# Patient Record
Sex: Female | Born: 1950 | Race: Black or African American | Hispanic: No | Marital: Married | Smoking: Never smoker
Health system: Southern US, Community
[De-identification: ages and names within clinical notes are randomized; demographics above are authoritative.]

## PROBLEM LIST (undated history)

## (undated) DIAGNOSIS — E785 Hyperlipidemia, unspecified: Secondary | ICD-10-CM

## (undated) DIAGNOSIS — E119 Type 2 diabetes mellitus without complications: Secondary | ICD-10-CM

## (undated) DIAGNOSIS — J309 Allergic rhinitis, unspecified: Secondary | ICD-10-CM

## (undated) DIAGNOSIS — E669 Obesity, unspecified: Secondary | ICD-10-CM

## (undated) DIAGNOSIS — I10 Essential (primary) hypertension: Secondary | ICD-10-CM

## (undated) DIAGNOSIS — E042 Nontoxic multinodular goiter: Secondary | ICD-10-CM

## (undated) DIAGNOSIS — G4733 Obstructive sleep apnea (adult) (pediatric): Secondary | ICD-10-CM

## (undated) DIAGNOSIS — H409 Unspecified glaucoma: Secondary | ICD-10-CM

## (undated) DIAGNOSIS — I517 Cardiomegaly: Secondary | ICD-10-CM

## (undated) DIAGNOSIS — E1139 Type 2 diabetes mellitus with other diabetic ophthalmic complication: Secondary | ICD-10-CM

## (undated) HISTORY — DX: Type 2 diabetes mellitus with other diabetic ophthalmic complication: E11.39

## (undated) HISTORY — DX: Obstructive sleep apnea (adult) (pediatric): G47.33

## (undated) HISTORY — DX: Allergic rhinitis, unspecified: J30.9

## (undated) HISTORY — DX: Unspecified glaucoma: H40.9

## (undated) HISTORY — DX: Obesity, unspecified: E66.9

## (undated) HISTORY — DX: Type 2 diabetes mellitus without complications: E11.9

## (undated) HISTORY — PX: TONSILLECTOMY: SHX5217

## (undated) HISTORY — PX: OTHER SURGICAL HISTORY: SHX169

## (undated) HISTORY — PX: VAGINAL HYSTERECTOMY: SHX2639

## (undated) HISTORY — DX: Essential (primary) hypertension: I10

## (undated) HISTORY — DX: Cardiomegaly: I51.7

## (undated) HISTORY — DX: Nontoxic multinodular goiter: E04.2

## (undated) HISTORY — DX: Hyperlipidemia, unspecified: E78.5

---

## 1999-03-01 ENCOUNTER — Ambulatory Visit (HOSPITAL_COMMUNITY): Admission: RE | Admit: 1999-03-01 | Discharge: 1999-03-01 | Payer: Self-pay | Admitting: Family Medicine

## 1999-03-01 ENCOUNTER — Encounter: Payer: Self-pay | Admitting: Family Medicine

## 1999-03-12 ENCOUNTER — Encounter: Payer: Self-pay | Admitting: Family Medicine

## 1999-03-12 ENCOUNTER — Ambulatory Visit (HOSPITAL_COMMUNITY): Admission: RE | Admit: 1999-03-12 | Discharge: 1999-03-12 | Payer: Self-pay | Admitting: Family Medicine

## 2003-07-03 ENCOUNTER — Encounter: Admission: RE | Admit: 2003-07-03 | Discharge: 2003-07-26 | Payer: Self-pay | Admitting: Family Medicine

## 2004-04-15 ENCOUNTER — Encounter: Admission: RE | Admit: 2004-04-15 | Discharge: 2004-07-14 | Payer: Self-pay | Admitting: Family Medicine

## 2004-04-18 DIAGNOSIS — E042 Nontoxic multinodular goiter: Secondary | ICD-10-CM

## 2004-04-26 ENCOUNTER — Other Ambulatory Visit: Admission: RE | Admit: 2004-04-26 | Discharge: 2004-04-26 | Payer: Self-pay | Admitting: Family Medicine

## 2004-06-28 ENCOUNTER — Ambulatory Visit (HOSPITAL_COMMUNITY): Admission: RE | Admit: 2004-06-28 | Discharge: 2004-06-28 | Payer: Self-pay | Admitting: Gastroenterology

## 2004-08-26 ENCOUNTER — Encounter: Admission: RE | Admit: 2004-08-26 | Discharge: 2004-08-26 | Payer: Self-pay | Admitting: Family Medicine

## 2005-12-17 IMAGING — CR DG CHEST 2V
2 series · 2 of 2 positions shown · non-contrast
Comparison: None.

CLINICAL DATA: Chronic cough. 
 TWO VIEW CHEST:

[view not recorded (1 of 2)]
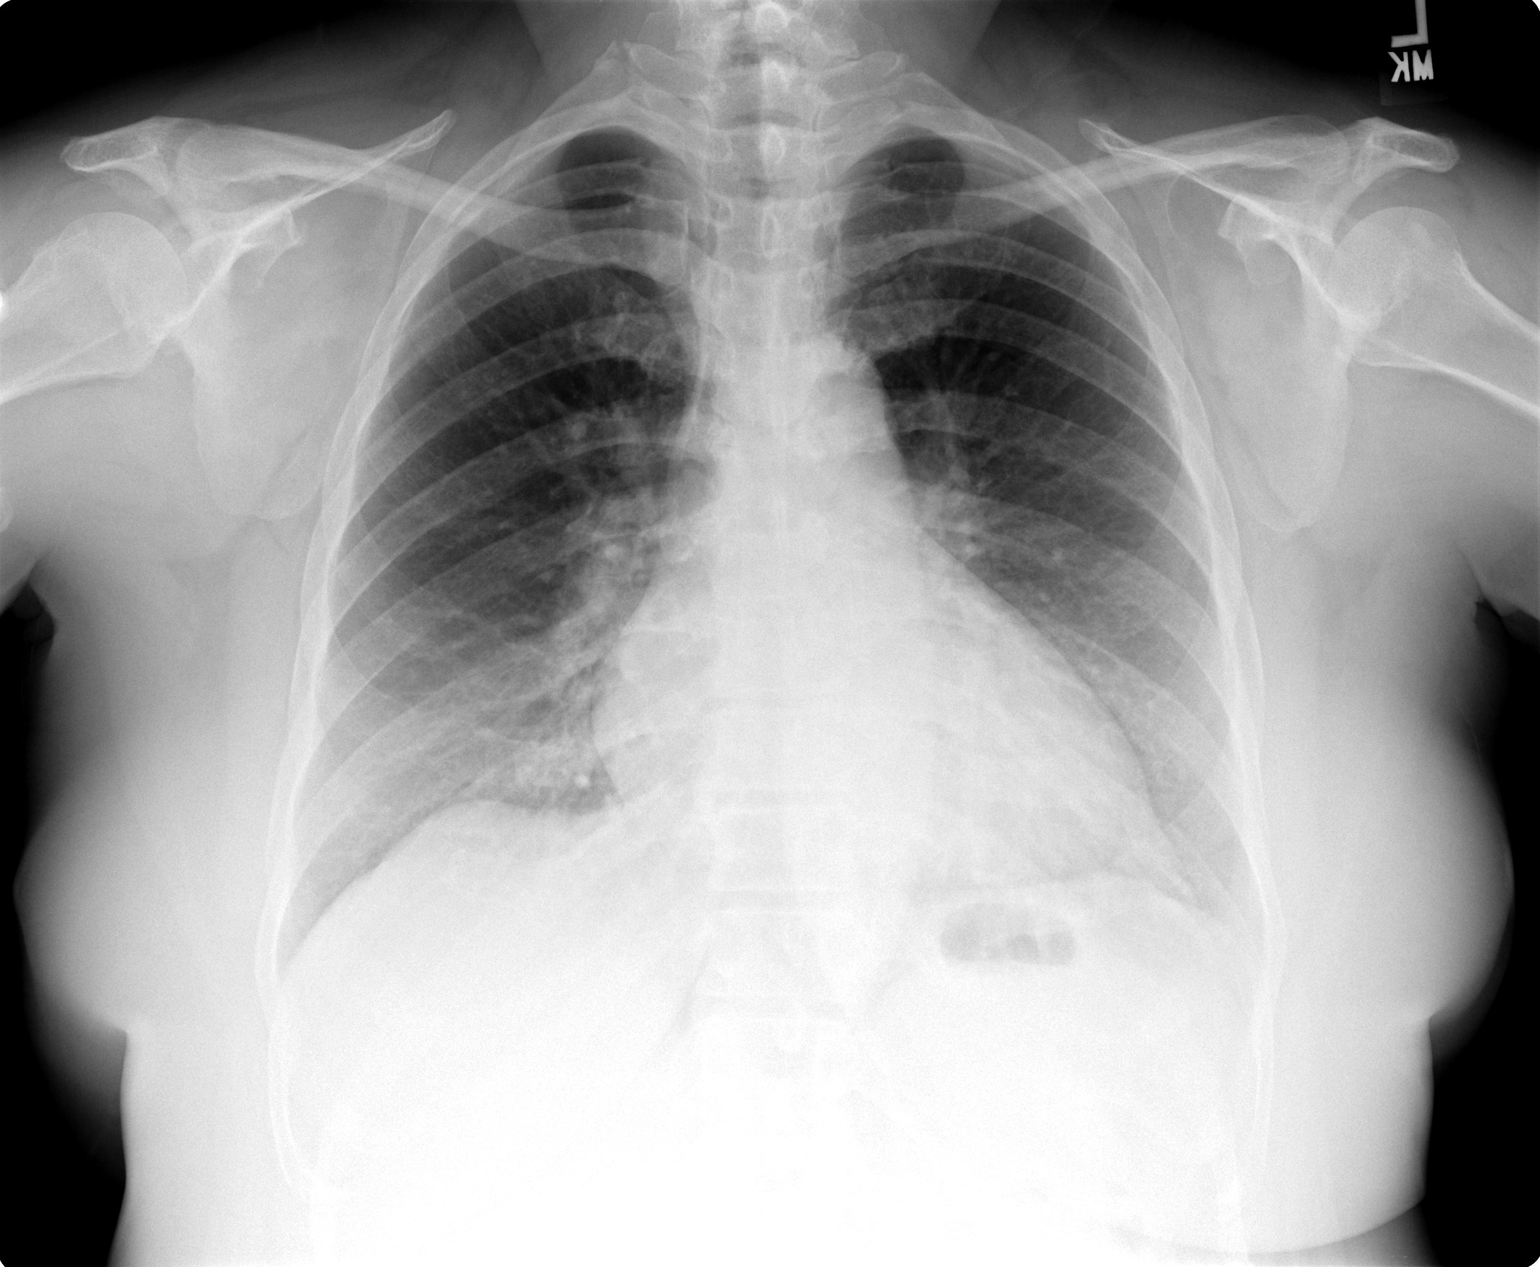

[view not recorded (2 of 2)]
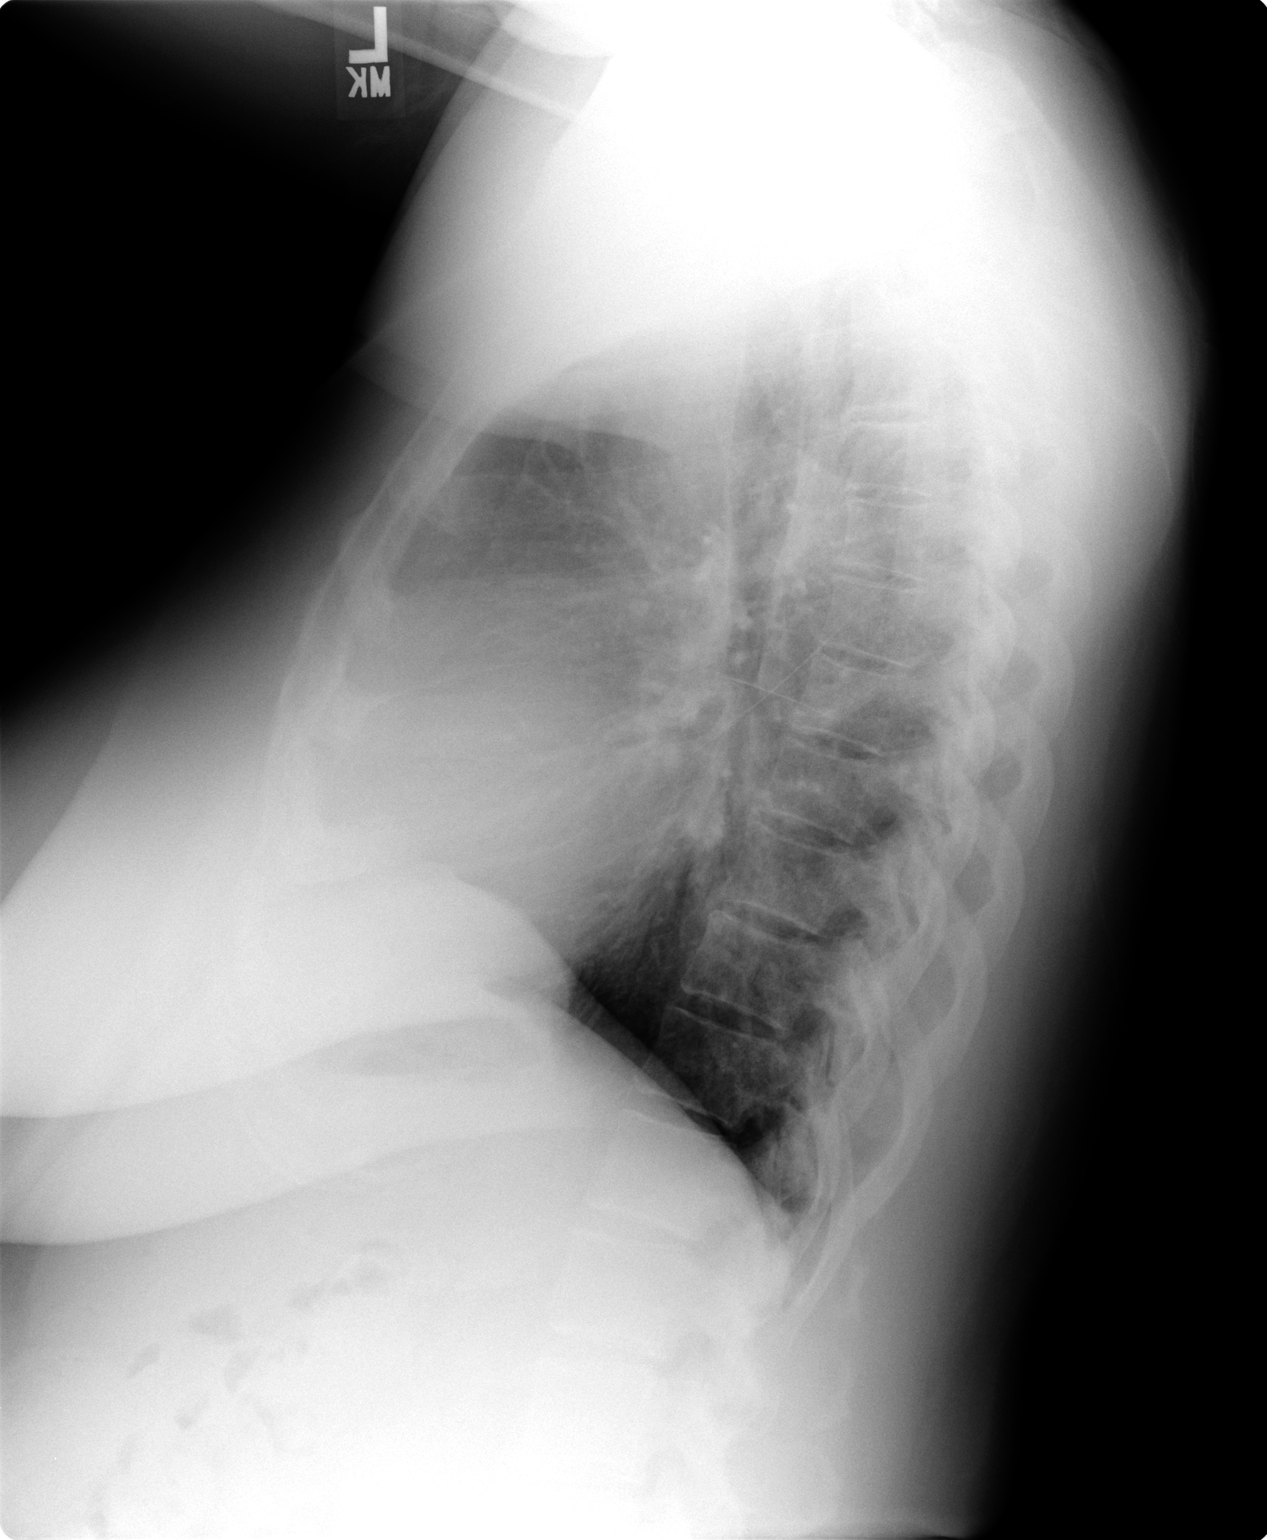

[2 of 2 positions shown; findings below may reference images not displayed]

Heart mildly enlarged.  No congestive heart failure or active disease.  Osseous structures intact.   No pleural fluid.
IMPRESSION: Mild cardiomegaly ? no active disease.

## 2010-07-04 ENCOUNTER — Ambulatory Visit: Admission: RE | Admit: 2010-07-04 | Discharge: 2010-07-04 | Payer: Self-pay | Admitting: Family Medicine

## 2010-09-23 ENCOUNTER — Ambulatory Visit (HOSPITAL_COMMUNITY)
Admission: RE | Admit: 2010-09-23 | Discharge: 2010-09-23 | Disposition: A | Discharge status: Home / Self Care | Payer: BC Managed Care – PPO | Source: Ambulatory Visit | Attending: Family Medicine | Admitting: Family Medicine

## 2010-09-23 ENCOUNTER — Encounter: Payer: Self-pay | Admitting: Emergency Medicine

## 2010-09-23 ENCOUNTER — Other Ambulatory Visit: Payer: Self-pay | Admitting: Family Medicine

## 2010-09-23 DIAGNOSIS — I517 Cardiomegaly: Secondary | ICD-10-CM | POA: Insufficient documentation

## 2010-09-23 DIAGNOSIS — R059 Cough, unspecified: Secondary | ICD-10-CM | POA: Insufficient documentation

## 2010-09-23 DIAGNOSIS — I10 Essential (primary) hypertension: Secondary | ICD-10-CM | POA: Insufficient documentation

## 2010-09-23 DIAGNOSIS — R05 Cough: Secondary | ICD-10-CM

## 2010-10-16 DIAGNOSIS — E1139 Type 2 diabetes mellitus with other diabetic ophthalmic complication: Secondary | ICD-10-CM | POA: Insufficient documentation

## 2010-10-16 DIAGNOSIS — E119 Type 2 diabetes mellitus without complications: Secondary | ICD-10-CM | POA: Insufficient documentation

## 2010-10-16 DIAGNOSIS — H409 Unspecified glaucoma: Secondary | ICD-10-CM | POA: Insufficient documentation

## 2010-10-16 DIAGNOSIS — I10 Essential (primary) hypertension: Secondary | ICD-10-CM | POA: Insufficient documentation

## 2010-10-16 DIAGNOSIS — G4733 Obstructive sleep apnea (adult) (pediatric): Secondary | ICD-10-CM | POA: Insufficient documentation

## 2010-10-16 DIAGNOSIS — E669 Obesity, unspecified: Secondary | ICD-10-CM | POA: Insufficient documentation

## 2010-10-16 DIAGNOSIS — E785 Hyperlipidemia, unspecified: Secondary | ICD-10-CM | POA: Insufficient documentation

## 2010-10-16 DIAGNOSIS — I517 Cardiomegaly: Secondary | ICD-10-CM | POA: Insufficient documentation

## 2010-10-17 ENCOUNTER — Encounter: Payer: Self-pay | Admitting: Internal Medicine

## 2010-10-17 ENCOUNTER — Institutional Professional Consult (permissible substitution) (INDEPENDENT_AMBULATORY_CARE_PROVIDER_SITE_OTHER): Payer: BC Managed Care – PPO | Admitting: Emergency Medicine

## 2010-10-17 ENCOUNTER — Encounter: Payer: Self-pay | Admitting: Emergency Medicine

## 2010-10-17 DIAGNOSIS — R05 Cough: Secondary | ICD-10-CM

## 2010-10-17 DIAGNOSIS — J309 Allergic rhinitis, unspecified: Secondary | ICD-10-CM | POA: Insufficient documentation

## 2010-10-17 DIAGNOSIS — R059 Cough, unspecified: Secondary | ICD-10-CM | POA: Insufficient documentation

## 2010-10-24 NOTE — Assessment & Plan Note (Signed)
Summary: new consult- cough   Visit Type:  Initial Consult Copy to:  Alexandra Jensen  CC:  Pulmonary consult.  Pt says she has been treated for seasonal allergies/cough twice a year for approx 3 years...she does c/o sinus drainage.  History of Present Illness: Alexandra Jensen  is a 60 y/o woman with PMH/problems as outlined in EMR.  Pt comes to the clinic today with c/o cough for about 4 months now. She has seasonal allergic cough for many years now, usually during  Spring and fall, which responds to her usual allergy meds. But this time she started having cough in around October 2011 which initially responded to allergy meds but then came back and is persistent until now. She has cough which is usually dry but also brings thich whitish sputum once in a while. She has no aggravating factors for her cough, it comes any time of day and gets better with sips of water or candy.   She denies any postnasal drip, nasal congestion, fever, headache, sore throat, dyspnea, GERD symptoms or Hx of Asthma  She has a Hx of cough while starting on ACE inhibitors. Currently she is on Losartan for about 2 yrs now.  Preventive Screening-Counseling & Management  Alcohol-Tobacco     Smoking Status: never  Current Medications (verified): 1)  Glipizide 10 Mg Tabs (Glipizide) .Marland Kitchen.. 1 By Mouth Two Times A Day 2)  Actoplus Met 15-850 Mg Tabs (Pioglitazone Hcl-Metformin Hcl) .Marland Kitchen.. 1 in The Morning and 2 in The Evening As Directed 3)  Losartan Potassium 50 Mg Tabs (Losartan Potassium) .Marland Kitchen.. 1 By Mouth Daily 4)  Hydrochlorothiazide 25 Mg Tabs (Hydrochlorothiazide) .Marland Kitchen.. 1 By Mouth Daily 5)  Crestor 10 Mg Tabs (Rosuvastatin Calcium) .Marland Kitchen.. 1 By Mouth Daily 6)  Allegra-D Allergy & Congestion 60-120 Mg Xr12h-Tab (Fexofenadine-Pseudoephedrine) .Marland Kitchen.. 1 By Mouth Daily 7)  Fluticasone Propionate 50 Mcg/act Susp (Fluticasone Propionate) .... 2 Sprays in Each Nostril Daily 8)  Omeprazole 20 Mg Cpdr (Omeprazole) .Marland Kitchen.. 1 By Mouth  Daily  Allergies (verified): 1)  ! Pcn 2)  ! 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         as per HPI, all other systems reviewed and negative  Vital Signs:  Patient profile:   60 year old female Height:      65 inches (165.10 cm) Weight:      254.38 pounds (115.63 kg) BMI:     42.48 O2 Sat:      95 % on Room air Temp:     97.7 degrees F (36.50 degrees C) oral Pulse rate:   77 / minute BP sitting:   132 / 66  (left arm) Cuff size:   large  Vitals Entered By: Alexandra Jensen CMA (October 17, 2010 2:07 PM)  O2 Sat at Rest %:  95 O2 Flow:  Room air CC: Pulmonary consult.  Pt says she has been treated for seasonal allergies/cough twice a year for approx 3 years...she does c/o sinus drainage Is Patient Diabetic? Yes Comments Medications reviewed with patient Alexandra Jensen CMA  October 17, 2010 2:25 PM   Physical Exam  Additional Exam:  Gen: Patient is in NAD, Pleasant. Eyes: PERRL, EOMI, No signs of anemia or jaundince. ENT: MMM, OP clear, No erythema, thrush or exudates. Neck:  Supple, No carotid Bruits, No JVD, No thyromegaly Resp: CTA- Bilaterally, No W/C/R. CVS: S1S2 RRR, No M/R/G GI: Abdomen is soft. ND, NT, NG, NR, BS+. No organomegaly. Ext: 1+ pedal edema, no cyanosis or clubbing. GU: No CVA tenderness. Skin: No visible rashes, scars. Lymph: No palpable lymphadenopathy. Alexandra: Moving all 4 extremities. Neuro: A&O X3, CN II - XII are grossly intact. Motor strength is 5/5 in the all 4 extremities, Sensations intact to light touch, Gait normal, Cerebellar signs negative. Psych: Appropriate    Impression & Recommendations:  Problem # 1:  COUGH, CHRONIC (ICD-786.2) Assessment New Alexandra Jensen has chronic seasonal allergic cough for a long time now as per HPI. She has a recent CXR which didn't show any acute pulmonary changes. Also she denies smoking or any new exposure to enviromental allergans. Looking at her Hx and her pattern of cough, she might be having a component of GERD affecting her cough, even though she denies any symptoms. So will start her on Omeprazole and continue her current meds as usual and follow up in a month.  Also will perform PFT's at next visit to have an idea of her baseline lung function and to r/o any obstructive or restrictive lung disease. Also considering her adverse reaction to ACE inhibitors in form of severe coug long time before, Losartan might be the culprit if everything else is ruled out.  Problem # 2:  ALLERGIC RHINITIS (ICD-477.9) She denies any itching or redness or eyes, post nasal drip, nasal congestion for now, though will continue her allergy regimen as it is. Her updated medication list for this problem includes:    Fluticasone Propionate 50 Mcg/act Susp (Fluticasone propionate) .Marland Kitchen... 2 sprays in each nostril daily  Problem # 3:  HYPERTENSION (ICD-401.9) BP is 132/66, in good control now with current meds. As mentioned before, Losartan may be tigerring factor for her cough, but is unlikely for now and also she needs an ARB  of ACE inhibitor, considering her DM and other risk factors. So will not change any meds for now. Her updated medication list for this problem includes:    Losartan Potassium 50 Mg Tabs (Losartan potassium) .Marland Kitchen... 1 by mouth daily    Hydrochlorothiazide 25 Mg Tabs (Hydrochlorothiazide) .Marland Kitchen... 1 by mouth daily  Medications Added to Medication List This Visit: 1)  Allegra-d Allergy & Congestion 60-120 Mg Xr12h-tab (Fexofenadine-pseudoephedrine) .Marland Kitchen.. 1 by mouth daily 2)  Fluticasone Propionate 50 Mcg/act Susp (Fluticasone propionate) .... 2 sprays in each nostril daily 3)  Omeprazole 20 Mg Cpdr (Omeprazole) .Marland Kitchen.. 1 by mouth daily  Complete Medication List: 1)  Glipizide 10 Mg Tabs (Glipizide) .Marland Kitchen.. 1 by mouth two times a day 2)  Actoplus Met 15-850 Mg Tabs (Pioglitazone hcl-metformin hcl) .Marland Kitchen.. 1 in the morning and 2 in the evening as directed 3)  Losartan Potassium 50 Mg Tabs (Losartan potassium) .Marland Kitchen.. 1 by mouth daily 4)  Hydrochlorothiazide 25 Mg Tabs (Hydrochlorothiazide) .Marland Kitchen.. 1 by mouth  daily 5)  Crestor 10 Mg Tabs (Rosuvastatin calcium) .Marland Kitchen.. 1 by mouth daily 6)  Allegra-d Allergy & Congestion 60-120 Mg Xr12h-tab (Fexofenadine-pseudoephedrine) .Marland Kitchen.. 1 by mouth daily 7)  Fluticasone Propionate 50 Mcg/act Susp (Fluticasone propionate) .... 2 sprays in each nostril daily 8)  Omeprazole 20 Mg Cpdr (Omeprazole) .Marland Kitchen.. 1 by mouth daily  Patient Instructions: 1)  Please continue to take Allegra and nasal spray as you are taking now. 2)  Also start taking Omeprazole 20mg  daily along with your other medicines. 3)  F/u appt with Dr. Delton Coombes in 1 month with PFT. Prescriptions: OMEPRAZOLE 20 MG CPDR (OMEPRAZOLE) 1 by mouth daily  #30 x 6   Entered by:   Alexandra Jensen CMA   Authorized by:   Leslye Peer MD   Signed by:   Lyn Hollingshead on 10/17/2010   Method used:   Electronically to        [facility] [phone]* (retail)       [address]       [place], [place]  [address]        Ph: [phone] or [phone]       Fax: [phone]   RxID:   [id number]   Appended Document: new consult- cough I have seen and evaluated Alexandra Darin with Dr Vilda Zollner. I agree with the database, exam and plans outlined in his note. We will continue her current therapy for allergic rhinitis, attempt to suppress any component of GERD, check full PFT. If no better we could consider impact of her ARB. If she is still coughing after full workup then will perform FOB to r/o anatomical component.

## 2010-10-24 NOTE — Miscellaneous (Signed)
Summary: Orders Update  Clinical Lists Changes  Orders: Added new Service order of Consultation Level IV (99244) - Signed 

## 2010-10-29 NOTE — Letter (Signed)
Summary: Deboraha Sprang at [facility]   Imported By: Lennie Odor 10/22/2010 11:58:46  _____________________________________________________________________  External Attachment:    Type:   Image     Comment:   External Document

## 2010-11-20 ENCOUNTER — Encounter: Payer: Self-pay | Admitting: Emergency Medicine

## 2010-11-25 ENCOUNTER — Ambulatory Visit (INDEPENDENT_AMBULATORY_CARE_PROVIDER_SITE_OTHER): Payer: BC Managed Care – PPO | Admitting: Emergency Medicine

## 2010-11-25 ENCOUNTER — Encounter: Payer: Self-pay | Admitting: Emergency Medicine

## 2010-11-25 VITALS — BP 126/72 | HR 87 | Temp 98.3°F | Ht 65.0 in | Wt 255.0 lb

## 2010-11-25 DIAGNOSIS — R05 Cough: Secondary | ICD-10-CM

## 2010-11-25 LAB — PULMONARY FUNCTION TEST

## 2010-11-25 NOTE — Progress Notes (Signed)
  Subjective:    Patient ID: Alexandra Jensen, female    DOB: [birth year], 60 y.o.   MRN: [id number]  HPI    Review of Systems     Objective:   Physical Exam        Assessment & Plan:

## 2010-11-25 NOTE — Progress Notes (Signed)
PFT done today. 

## 2010-11-25 NOTE — Progress Notes (Signed)
  Subjective:    Patient ID: Alexandra Jensen, female    DOB: April 27, 1951, 60 y.o.   MRN: [id number]  Cough Associated symptoms include postnasal drip. Pertinent negatives include no chest pain, shortness of breath or wheezing.  60 yo woman, seen for cough 10/17/10. She was on allegra/flonase. I added omeprazole empirically. In retrospect she believes much of her problem was related to allergies. Her cough is much better, now using allegra prn, flonase rarely.     Review of Systems  HENT: Positive for congestion, sneezing and postnasal drip.   Respiratory: Positive for cough (improved). Negative for choking, chest tightness, shortness of breath, wheezing and stridor.   Cardiovascular: Negative for chest pain.       Objective:   Physical Exam  Constitutional: No distress.  HENT:  Head: Normocephalic and atraumatic.  Mouth/Throat: No oropharyngeal exudate.  Eyes: Pupils are equal, round, and reactive to light.  Neck: Normal range of motion. Neck supple.  Cardiovascular: Normal rate and regular rhythm.   No murmur heard. Pulmonary/Chest: Effort normal and breath sounds normal. No stridor. No respiratory distress. She has no wheezes.  Skin: She is not diaphoretic.   Pleasant obese woman        Assessment & Plan:

## 2010-11-25 NOTE — Assessment & Plan Note (Signed)
Due to allergic rhinitis - currently improved - continue allegra/flonase, consider taking every day during the allergy season - d/c omeprazole - rov prn

## 2010-11-25 NOTE — Patient Instructions (Signed)
Your pulmonary testing does not show evidence for asthma or COPD Please use your allegra and flonase as needed to treat your allergies and cough. You may need to use these every day during the allergy season.  Follow with Dr Delton Coombes if you have any new problems.

## 2010-12-03 ENCOUNTER — Encounter: Payer: Self-pay | Admitting: Emergency Medicine

## 2011-01-03 NOTE — Op Note (Signed)
Alexandra Jensen, Alexandra Jensen             ACCOUNT NO.:  [id number]   MEDICAL RECORD NO.:  [id number]          PATIENT TYPE:  AMB   LOCATION:  ENDO                         FACILITY:  [facility]   PHYSICIAN:  John C. Madilyn Fireman, M.D.    DATE OF BIRTH:  Jun 11, 1951   DATE OF PROCEDURE:  06/28/2004  DATE OF DISCHARGE:                                 OPERATIVE REPORT   PROCEDURE:  Colonoscopy.   INDICATIONS FOR PROCEDURE:  Average-risk colon cancer screening.   PROCEDURE:  The patient was placed in the left lateral decubitus position  and placed on the pulse monitor with continuous low flow oxygen delivered by  nasal cannula.  She was sedated with 75 mcg IV fentanyl and 6 mg IV Versed.  The Olympus video colonoscope is inserted into the rectum and advanced to  the cecum, confirmed by transillumination at McBurney's point and  visualization of the ileocecal valve and appendiceal orifice.  Prep was  good.  The cecum, ascending, transverse, descending, and sigmoid colon all  appeared normal with no masses, polyps, diverticula, or other mucosal  abnormalities.  The rectum likewise appeared normal, and retroflexed view of  the anus revealed no obvious internal hemorrhoids.  The scope was then  withdrawn, and the patient returned to the recovery room in stable  condition.  She tolerated the procedure well, and there were no immediate  complications.   IMPRESSION:  Normal colonoscopy.   PLAN:  Repeat colonoscopy in 10 years and consider a sigmoidoscopy or other  interim screening in five years.      J[facility]/MEDQ  D:  06/28/2004  T:  06/28/2004  Job:  [id number]   cc:   Lavonda Jumbo, M.D.  [address] [place], [place] [address]  Fax: [phone]

## 2012-01-14 IMAGING — CR DG CHEST 2V
2 series · 2 of 2 positions shown · non-contrast
Comparison: 08/26/2004

CLINICAL DATA: Cough for several months.  History of hypertension.

CHEST - 2 VIEW

[w chest pa]
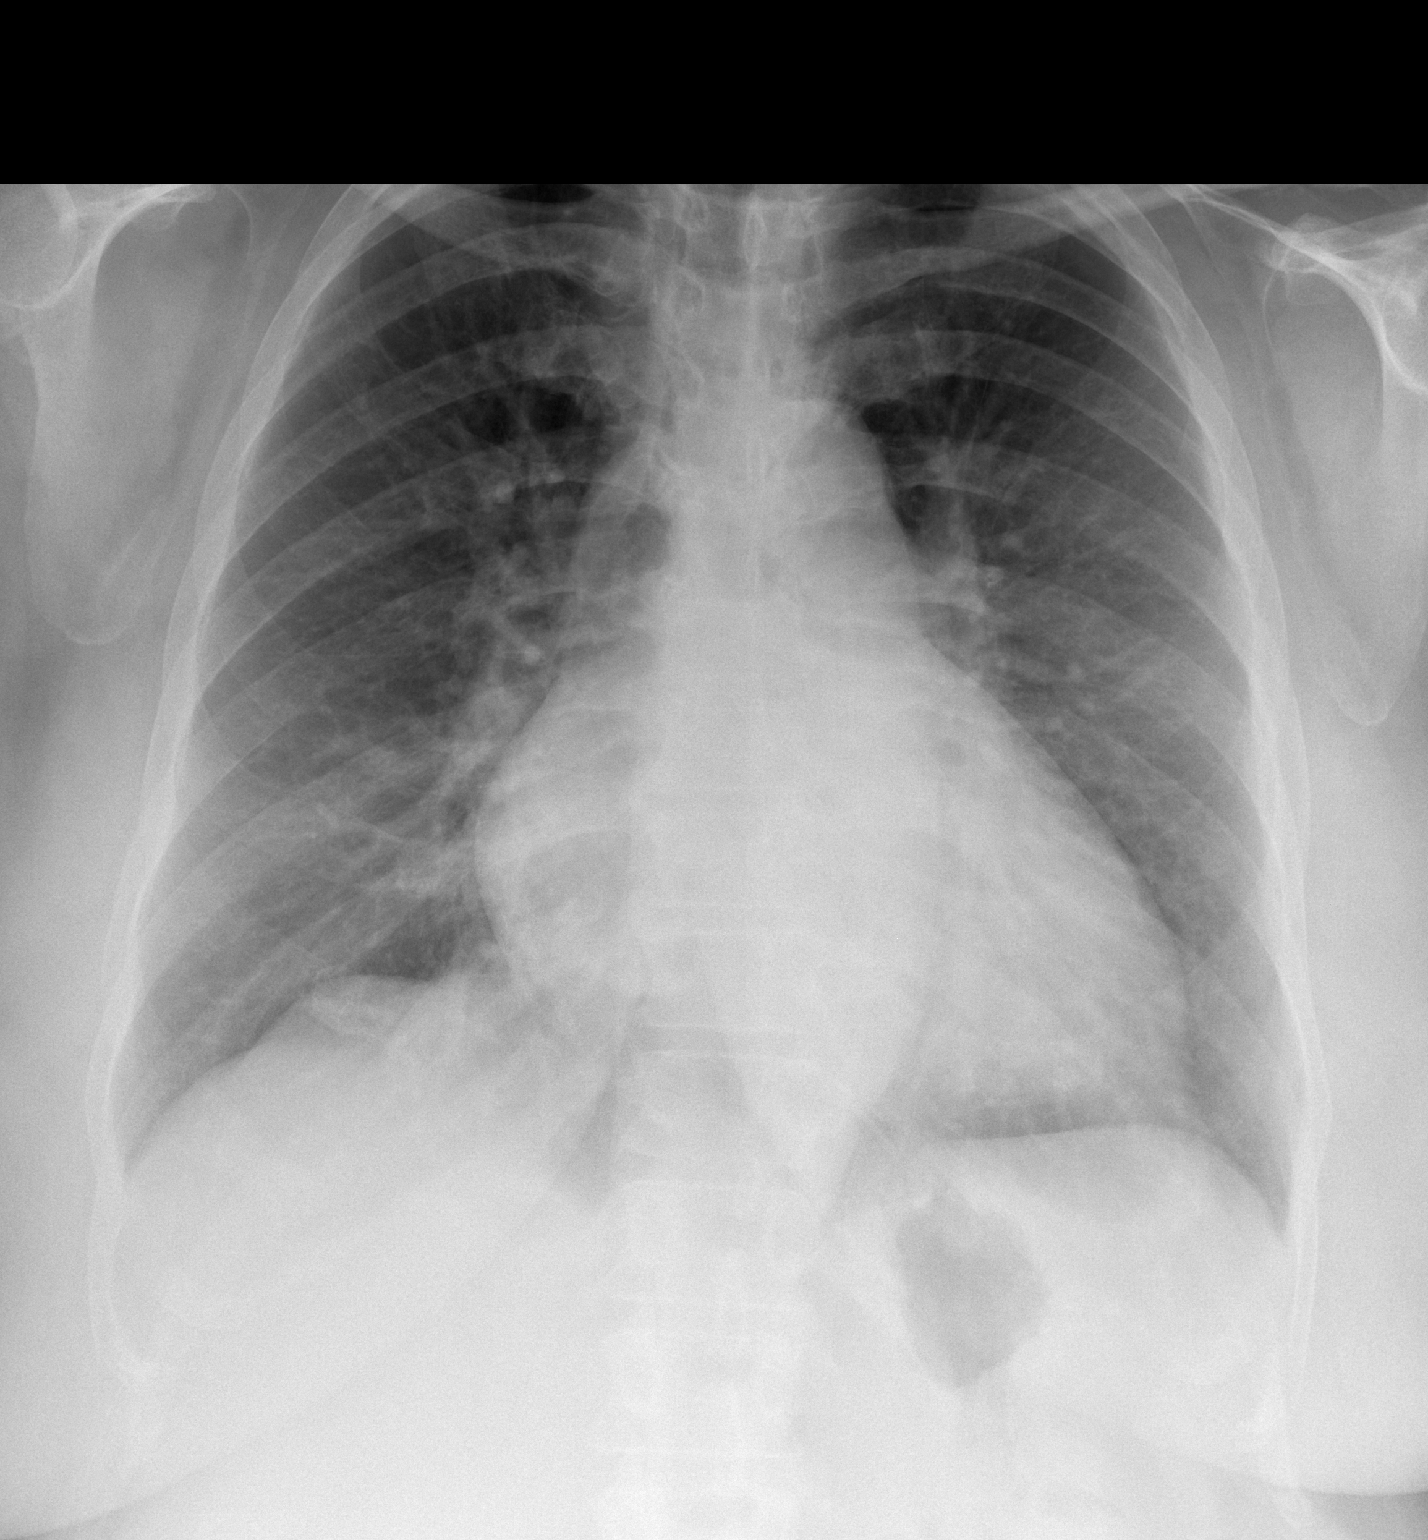

[w chest lat]
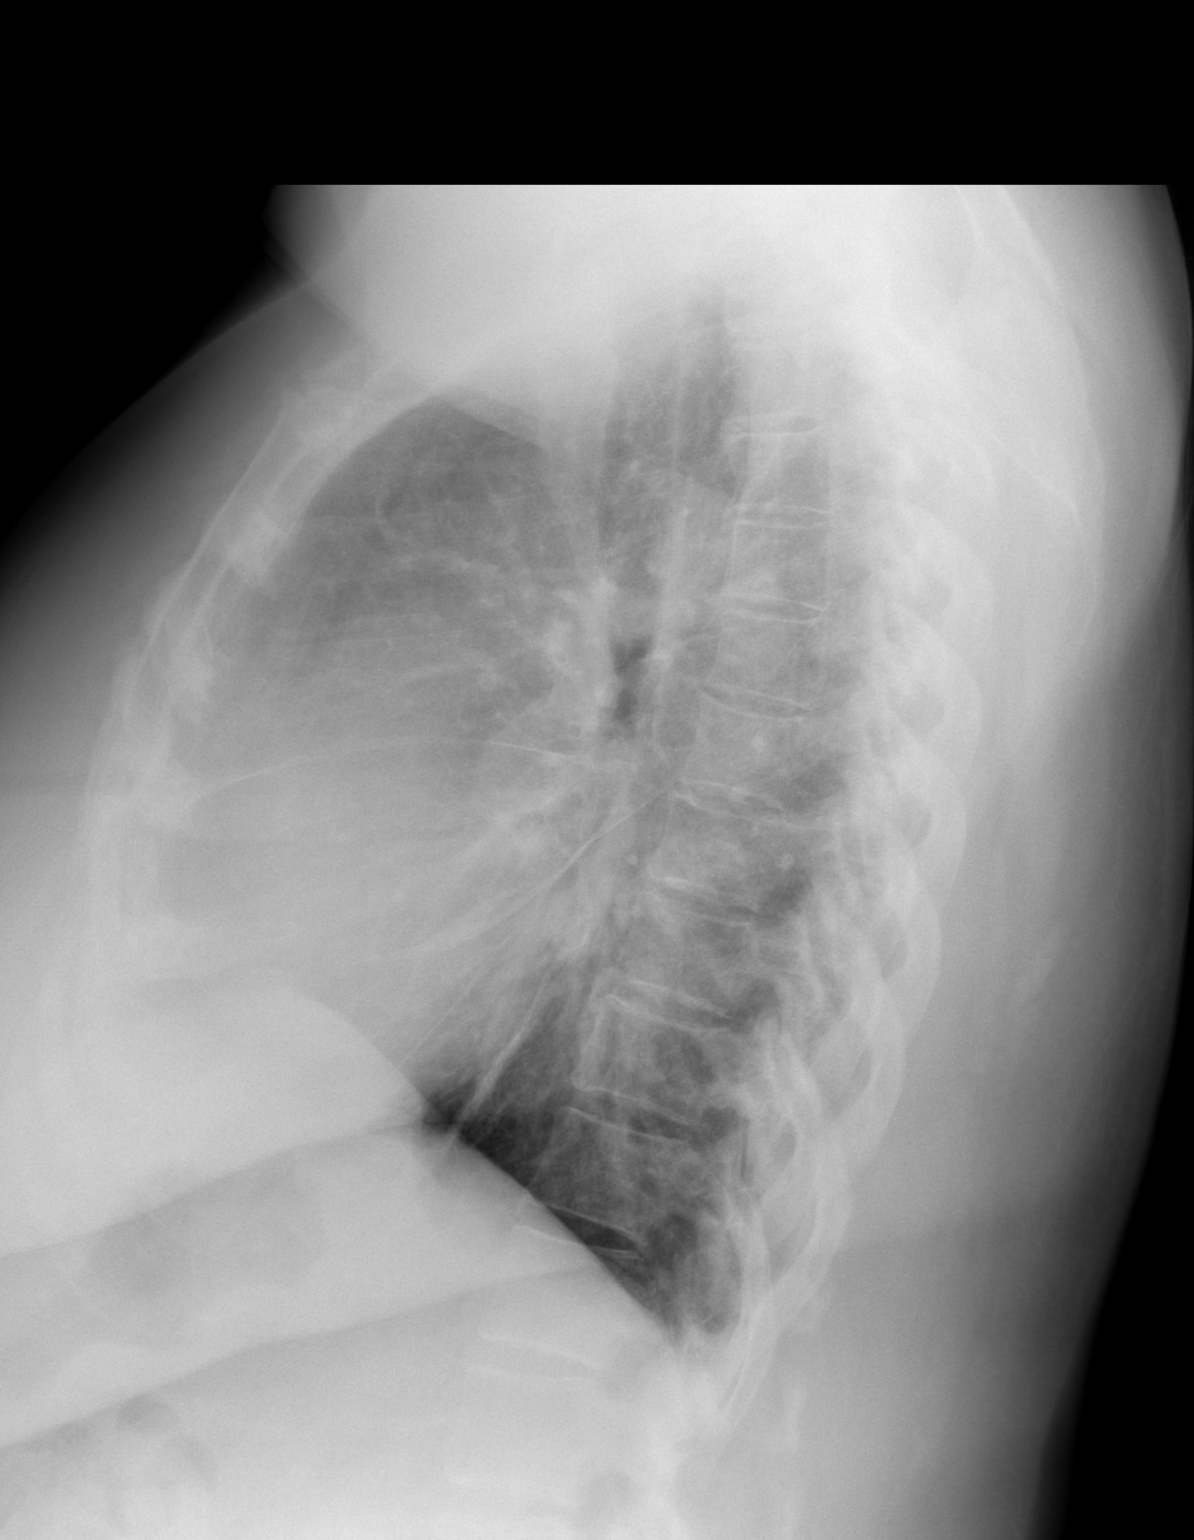

[2 of 2 positions shown; findings below may reference images not displayed]

FINDINGS: The heart is enlarged.  There are no focal consolidations
or pleural effusions.  No pulmonary edema.
IMPRESSION: Cardiomegaly.

## 2015-06-04 DIAGNOSIS — E785 Hyperlipidemia, unspecified: Secondary | ICD-10-CM | POA: Diagnosis not present

## 2015-06-04 DIAGNOSIS — E1165 Type 2 diabetes mellitus with hyperglycemia: Secondary | ICD-10-CM | POA: Diagnosis not present

## 2015-06-04 DIAGNOSIS — I10 Essential (primary) hypertension: Secondary | ICD-10-CM | POA: Diagnosis not present

## 2015-06-04 DIAGNOSIS — E11319 Type 2 diabetes mellitus with unspecified diabetic retinopathy without macular edema: Secondary | ICD-10-CM | POA: Diagnosis not present

## 2015-06-04 DIAGNOSIS — Z23 Encounter for immunization: Secondary | ICD-10-CM | POA: Diagnosis not present

## 2015-06-04 DIAGNOSIS — G4733 Obstructive sleep apnea (adult) (pediatric): Secondary | ICD-10-CM | POA: Diagnosis not present

## 2015-06-04 DIAGNOSIS — Z9119 Patient's noncompliance with other medical treatment and regimen: Secondary | ICD-10-CM | POA: Diagnosis not present

## 2015-06-14 DIAGNOSIS — E1165 Type 2 diabetes mellitus with hyperglycemia: Secondary | ICD-10-CM | POA: Diagnosis not present

## 2015-06-14 DIAGNOSIS — I10 Essential (primary) hypertension: Secondary | ICD-10-CM | POA: Diagnosis not present

## 2015-06-14 DIAGNOSIS — E7800 Pure hypercholesterolemia, unspecified: Secondary | ICD-10-CM | POA: Diagnosis not present

## 2015-07-16 DIAGNOSIS — H5213 Myopia, bilateral: Secondary | ICD-10-CM | POA: Diagnosis not present

## 2015-07-16 DIAGNOSIS — H524 Presbyopia: Secondary | ICD-10-CM | POA: Diagnosis not present

## 2015-07-16 DIAGNOSIS — H2513 Age-related nuclear cataract, bilateral: Secondary | ICD-10-CM | POA: Diagnosis not present

## 2015-07-16 DIAGNOSIS — E113393 Type 2 diabetes mellitus with moderate nonproliferative diabetic retinopathy without macular edema, bilateral: Secondary | ICD-10-CM | POA: Diagnosis not present

## 2015-07-19 ENCOUNTER — Encounter: Payer: Medicare Other | Attending: Endocrinology | Admitting: *Deleted

## 2015-07-19 ENCOUNTER — Encounter: Payer: Self-pay | Admitting: *Deleted

## 2015-07-19 VITALS — Ht 65.0 in | Wt 239.7 lb

## 2015-07-19 DIAGNOSIS — E119 Type 2 diabetes mellitus without complications: Secondary | ICD-10-CM | POA: Insufficient documentation

## 2015-07-19 NOTE — Progress Notes (Signed)
Diabetes Self-Management Education  Visit Type: First/Initial  Appt. Start Time: 0900 Appt. End Time: 1030  07/19/2015  [name], identified by name and date of birth, is a 64 y.o. female with a diagnosis of Diabetes: Type 2. Alexandra Jensen  Has had T2DM for 15 years. She is educated and knowledgeable about the consequences of poorly managed diabetes. Her mother had double leg amputation and passed away in her 9750's. However, Alexandra Jensen has her times where he chooses not to take her medication. In review of her dietary intake she has a great many opportunities for improvement. Although her verbalizes that she is motivated to make necessary changes to manage her glucose, I question her ability to be successful. I question her conviction to do so.  ASSESSMENT  Height 5\' 5"  (1.651 m), weight 239 lb 11.2 oz (108.727 kg). Body mass index is 39.89 kg/(m^2).      Diabetes Self-Management Education - 07/19/15 [id number]    Visit Information   Visit Type First/Initial   Initial Visit   Diabetes Type Type 2   Are you currently following a meal plan? No   Are you taking your medications as prescribed? No   Health Coping   How would you rate your overall health? Good   Psychosocial Assessment   Patient Belief/Attitude about Diabetes Denial   Self-care barriers Other (comment)  Refusal to comply    Self-management support Doctor's office;CDE visits   Other persons present Patient   Patient Concerns Nutrition/Meal planning;Medication;Monitoring;Healthy Lifestyle;Glycemic Control;Weight Control   Special Needs None   Preferred Learning Style No preference indicated   Learning Readiness Ready   How often do you need to have someone help you when you read instructions, pamphlets, or other written materials from your doctor or pharmacy? 1 - Never   What is the last grade level you completed in school? Masters Degree - Research officer, trade unionducator [place] Alexandra Jensen   Complications   Last HgB A1C per  patient/outside source 12.9 %   How often do you check your blood sugar? 1-2 times/day   Fasting Blood glucose range (mg/dL) 960-454;>098130-179;>200  119-147+134-200+   Have you had a dilated eye exam in the past 12 months? Yes   Have you had a dental exam in the past 12 months? Yes   Are you checking your feet? Yes   How many days per week are you checking your feet? 3   Dietary Intake   Breakfast 1 1/2C oatmeal cooked on stove, fruit and fruit juice / bacon, eggs, waffles,/ croissant bacon egg & cheese / breakfast sandwich   Snack (morning) pork skins, cheetos, gld fish, peanut butter crackers,     Lunch can chicken noodle of soup, [place] fried chicken (skinless), potatoes, claw, biscuit, / [employer] Salad / Foot long hotdog   Snack (afternoon) pork skins, cheetos, gold fish, peanut butter crackers   Dinner veggie plate  [employer], / meat, starch, vegetable   Snack (evening) None   Beverage(s) hot tea, honey / coffee / milk whole / regular soda( gingerAle) / water   Exercise   Exercise Type ADL's   Patient Education   Previous Diabetes Education Yes (please comment)  2004   Disease state  Definition of diabetes, type 1 and 2, and the diagnosis of diabetes;Factors that contribute to the development of diabetes   Nutrition management  Role of diet in the treatment of diabetes and the relationship between the three main macronutrients and blood glucose level;Food label reading, portion sizes and measuring  food.;Carbohydrate counting;Information on hints to eating out and maintain blood glucose control.;Meal options for control of blood glucose level and chronic complications.   Physical activity and exercise  Role of exercise on diabetes management, blood pressure control and cardiac health.   Medications Reviewed patients medication for diabetes, action, purpose, timing of dose and side effects.   Monitoring Purpose and frequency of SMBG.   Chronic complications Relationship between chronic complications and blood  glucose control   Psychosocial adjustment Role of stress on diabetes;Worked with patient to identify barriers to care and solutions   Personal strategies to promote health Lifestyle issues that need to be addressed for better diabetes care   Individualized Goals (developed by patient)   Nutrition General guidelines for healthy choices and portions discussed   Physical Activity Exercise 3-5 times per week;30 minutes per day   Medications take my medication as prescribed   Monitoring  test my blood glucose as discussed   Outcomes   Expected Outcomes Demonstrated interest in learning. Expect positive outcomes   Future DMSE PRN   Program Status Completed      Individualized Plan for Diabetes Self-Management Training:   Learning Objective:  Patient will have a greater understanding of diabetes self-management. Patient education plan is to attend individual and/or group sessions per assessed needs and concerns.    Patient Instructions  Plan:  Aim for 2 Carb Choices per meal (30 grams) +/- 1 either way  Aim for 0-15 Carbs per snack if hungry  Include protein in moderation with your meals and snacks Consider reading food labels for Total Carbohydrate and Fat Grams of foods Consider  increasing your activity level by walking for 30 minutes daily as tolerated Consider checking BG at alternate times per day as directed by MD  Continue taking medication as directed by MD  Change to 2% milk vs whole milk ALWAYS HAVE A PROTEIN WITH YOUR CARBOHYDRATES Snacks; [place] Balanced Breaks  Make an appropriate plate, eat slowly and do not go back for seconds Consider Light Greek Yogurt [facility] Protein Bars  Have some oatmeal with almonds   Expected Outcomes:  Demonstrated interest in learning. Expect positive outcomes  Education material provided: Living Well with Diabetes, A1C conversion sheet, Meal plan card, My Plate and Snack sheet  If problems or questions, patient to contact team  via:  Phone  Future DSME appointment: PRN

## 2015-07-19 NOTE — Patient Instructions (Signed)
Plan:  Aim for 2 Carb Choices per meal (30 grams) +/- 1 either way  Aim for 0-15 Carbs per snack if hungry  Include protein in moderation with your meals and snacks Consider reading food labels for Total Carbohydrate and Fat Grams of foods Consider  increasing your activity level by walking for 30 minutes daily as tolerated Consider checking BG at alternate times per day as directed by MD  Continue taking medication as directed by MD  Change to 2% milk vs whole milk ALWAYS HAVE A PROTEIN WITH YOUR CARBOHYDRATES Snacks; [place] Balanced Breaks  Make an appropriate plate, eat slowly and do not go back for seconds Consider Light Greek Yogurt [facility] Protein Bars  Have some oatmeal with almonds

## 2015-11-01 DIAGNOSIS — E7800 Pure hypercholesterolemia, unspecified: Secondary | ICD-10-CM | POA: Diagnosis not present

## 2015-11-01 DIAGNOSIS — I10 Essential (primary) hypertension: Secondary | ICD-10-CM | POA: Diagnosis not present

## 2015-11-01 DIAGNOSIS — E1165 Type 2 diabetes mellitus with hyperglycemia: Secondary | ICD-10-CM | POA: Diagnosis not present

## 2016-06-03 DIAGNOSIS — Z23 Encounter for immunization: Secondary | ICD-10-CM | POA: Diagnosis not present

## 2020-02-01 DIAGNOSIS — E1165 Type 2 diabetes mellitus with hyperglycemia: Secondary | ICD-10-CM | POA: Diagnosis not present

## 2020-02-01 DIAGNOSIS — Z86010 Personal history of colonic polyps: Secondary | ICD-10-CM | POA: Diagnosis not present

## 2020-02-08 DIAGNOSIS — E11319 Type 2 diabetes mellitus with unspecified diabetic retinopathy without macular edema: Secondary | ICD-10-CM | POA: Diagnosis not present

## 2020-02-08 DIAGNOSIS — E785 Hyperlipidemia, unspecified: Secondary | ICD-10-CM | POA: Diagnosis not present

## 2020-02-08 DIAGNOSIS — I10 Essential (primary) hypertension: Secondary | ICD-10-CM | POA: Diagnosis not present

## 2020-05-15 DIAGNOSIS — E113311 Type 2 diabetes mellitus with moderate nonproliferative diabetic retinopathy with macular edema, right eye: Secondary | ICD-10-CM | POA: Diagnosis not present

## 2020-05-21 DIAGNOSIS — H31093 Other chorioretinal scars, bilateral: Secondary | ICD-10-CM | POA: Diagnosis not present

## 2020-05-21 DIAGNOSIS — H25813 Combined forms of age-related cataract, bilateral: Secondary | ICD-10-CM | POA: Diagnosis not present

## 2020-05-21 DIAGNOSIS — H3581 Retinal edema: Secondary | ICD-10-CM | POA: Diagnosis not present

## 2020-05-21 DIAGNOSIS — H35033 Hypertensive retinopathy, bilateral: Secondary | ICD-10-CM | POA: Diagnosis not present

## 2020-05-21 DIAGNOSIS — E113513 Type 2 diabetes mellitus with proliferative diabetic retinopathy with macular edema, bilateral: Secondary | ICD-10-CM | POA: Diagnosis not present

## 2020-05-21 DIAGNOSIS — H3582 Retinal ischemia: Secondary | ICD-10-CM | POA: Diagnosis not present

## 2020-05-22 DIAGNOSIS — D649 Anemia, unspecified: Secondary | ICD-10-CM | POA: Diagnosis not present

## 2020-05-22 DIAGNOSIS — E11319 Type 2 diabetes mellitus with unspecified diabetic retinopathy without macular edema: Secondary | ICD-10-CM | POA: Diagnosis not present

## 2020-05-22 DIAGNOSIS — I10 Essential (primary) hypertension: Secondary | ICD-10-CM | POA: Diagnosis not present

## 2020-05-22 DIAGNOSIS — G4733 Obstructive sleep apnea (adult) (pediatric): Secondary | ICD-10-CM | POA: Diagnosis not present

## 2020-05-22 DIAGNOSIS — E669 Obesity, unspecified: Secondary | ICD-10-CM | POA: Diagnosis not present

## 2020-05-22 DIAGNOSIS — F4321 Adjustment disorder with depressed mood: Secondary | ICD-10-CM | POA: Diagnosis not present

## 2020-05-22 DIAGNOSIS — E785 Hyperlipidemia, unspecified: Secondary | ICD-10-CM | POA: Diagnosis not present

## 2020-06-06 DIAGNOSIS — E113412 Type 2 diabetes mellitus with severe nonproliferative diabetic retinopathy with macular edema, left eye: Secondary | ICD-10-CM | POA: Diagnosis not present

## 2020-06-27 DIAGNOSIS — E113311 Type 2 diabetes mellitus with moderate nonproliferative diabetic retinopathy with macular edema, right eye: Secondary | ICD-10-CM | POA: Diagnosis not present

## 2020-07-26 DIAGNOSIS — E113412 Type 2 diabetes mellitus with severe nonproliferative diabetic retinopathy with macular edema, left eye: Secondary | ICD-10-CM | POA: Diagnosis not present

## 2020-08-01 DIAGNOSIS — E042 Nontoxic multinodular goiter: Secondary | ICD-10-CM | POA: Diagnosis not present

## 2020-08-01 DIAGNOSIS — G4733 Obstructive sleep apnea (adult) (pediatric): Secondary | ICD-10-CM | POA: Diagnosis not present

## 2020-08-01 DIAGNOSIS — E11319 Type 2 diabetes mellitus with unspecified diabetic retinopathy without macular edema: Secondary | ICD-10-CM | POA: Diagnosis not present

## 2020-08-01 DIAGNOSIS — R300 Dysuria: Secondary | ICD-10-CM | POA: Diagnosis not present

## 2020-08-01 DIAGNOSIS — E785 Hyperlipidemia, unspecified: Secondary | ICD-10-CM | POA: Diagnosis not present

## 2020-08-01 DIAGNOSIS — N898 Other specified noninflammatory disorders of vagina: Secondary | ICD-10-CM | POA: Diagnosis not present

## 2020-08-01 DIAGNOSIS — E559 Vitamin D deficiency, unspecified: Secondary | ICD-10-CM | POA: Diagnosis not present

## 2020-08-01 DIAGNOSIS — Z0000 Encounter for general adult medical examination without abnormal findings: Secondary | ICD-10-CM | POA: Diagnosis not present

## 2020-08-01 DIAGNOSIS — I10 Essential (primary) hypertension: Secondary | ICD-10-CM | POA: Diagnosis not present

## 2020-08-27 DIAGNOSIS — E113412 Type 2 diabetes mellitus with severe nonproliferative diabetic retinopathy with macular edema, left eye: Secondary | ICD-10-CM | POA: Diagnosis not present

## 2020-10-03 DIAGNOSIS — E113512 Type 2 diabetes mellitus with proliferative diabetic retinopathy with macular edema, left eye: Secondary | ICD-10-CM | POA: Diagnosis not present

## 2020-10-31 DIAGNOSIS — I10 Essential (primary) hypertension: Secondary | ICD-10-CM | POA: Diagnosis not present

## 2020-10-31 DIAGNOSIS — E042 Nontoxic multinodular goiter: Secondary | ICD-10-CM | POA: Diagnosis not present

## 2020-10-31 DIAGNOSIS — E785 Hyperlipidemia, unspecified: Secondary | ICD-10-CM | POA: Diagnosis not present

## 2020-10-31 DIAGNOSIS — G4733 Obstructive sleep apnea (adult) (pediatric): Secondary | ICD-10-CM | POA: Diagnosis not present

## 2020-10-31 DIAGNOSIS — E11319 Type 2 diabetes mellitus with unspecified diabetic retinopathy without macular edema: Secondary | ICD-10-CM | POA: Diagnosis not present

## 2020-10-31 DIAGNOSIS — E559 Vitamin D deficiency, unspecified: Secondary | ICD-10-CM | POA: Diagnosis not present

## 2020-10-31 DIAGNOSIS — Z6835 Body mass index (BMI) 35.0-35.9, adult: Secondary | ICD-10-CM | POA: Diagnosis not present

## 2020-10-31 DIAGNOSIS — D509 Iron deficiency anemia, unspecified: Secondary | ICD-10-CM | POA: Diagnosis not present

## 2020-10-31 DIAGNOSIS — E669 Obesity, unspecified: Secondary | ICD-10-CM | POA: Diagnosis not present

## 2020-11-06 DIAGNOSIS — Z86010 Personal history of colonic polyps: Secondary | ICD-10-CM | POA: Diagnosis not present

## 2020-11-12 DIAGNOSIS — Z1231 Encounter for screening mammogram for malignant neoplasm of breast: Secondary | ICD-10-CM | POA: Diagnosis not present

## 2021-01-17 DIAGNOSIS — Z23 Encounter for immunization: Secondary | ICD-10-CM | POA: Diagnosis not present

## 2021-02-19 DIAGNOSIS — H31093 Other chorioretinal scars, bilateral: Secondary | ICD-10-CM | POA: Diagnosis not present

## 2021-02-19 DIAGNOSIS — E113513 Type 2 diabetes mellitus with proliferative diabetic retinopathy with macular edema, bilateral: Secondary | ICD-10-CM | POA: Diagnosis not present

## 2021-02-19 DIAGNOSIS — H40003 Preglaucoma, unspecified, bilateral: Secondary | ICD-10-CM | POA: Diagnosis not present

## 2021-02-19 DIAGNOSIS — H25813 Combined forms of age-related cataract, bilateral: Secondary | ICD-10-CM | POA: Diagnosis not present

## 2021-02-19 DIAGNOSIS — H3582 Retinal ischemia: Secondary | ICD-10-CM | POA: Diagnosis not present

## 2021-02-22 DIAGNOSIS — E669 Obesity, unspecified: Secondary | ICD-10-CM | POA: Diagnosis not present

## 2021-02-22 DIAGNOSIS — E785 Hyperlipidemia, unspecified: Secondary | ICD-10-CM | POA: Diagnosis not present

## 2021-02-22 DIAGNOSIS — Z6835 Body mass index (BMI) 35.0-35.9, adult: Secondary | ICD-10-CM | POA: Diagnosis not present

## 2021-02-22 DIAGNOSIS — Z7984 Long term (current) use of oral hypoglycemic drugs: Secondary | ICD-10-CM | POA: Diagnosis not present

## 2021-02-22 DIAGNOSIS — D509 Iron deficiency anemia, unspecified: Secondary | ICD-10-CM | POA: Diagnosis not present

## 2021-02-22 DIAGNOSIS — E042 Nontoxic multinodular goiter: Secondary | ICD-10-CM | POA: Diagnosis not present

## 2021-02-22 DIAGNOSIS — E11319 Type 2 diabetes mellitus with unspecified diabetic retinopathy without macular edema: Secondary | ICD-10-CM | POA: Diagnosis not present

## 2021-02-22 DIAGNOSIS — E559 Vitamin D deficiency, unspecified: Secondary | ICD-10-CM | POA: Diagnosis not present

## 2021-02-22 DIAGNOSIS — I10 Essential (primary) hypertension: Secondary | ICD-10-CM | POA: Diagnosis not present

## 2021-02-25 DIAGNOSIS — E1165 Type 2 diabetes mellitus with hyperglycemia: Secondary | ICD-10-CM | POA: Diagnosis not present

## 2021-02-25 DIAGNOSIS — Z86010 Personal history of colonic polyps: Secondary | ICD-10-CM | POA: Diagnosis not present

## 2021-03-01 DIAGNOSIS — E113511 Type 2 diabetes mellitus with proliferative diabetic retinopathy with macular edema, right eye: Secondary | ICD-10-CM | POA: Diagnosis not present

## 2021-03-06 DIAGNOSIS — E113412 Type 2 diabetes mellitus with severe nonproliferative diabetic retinopathy with macular edema, left eye: Secondary | ICD-10-CM | POA: Diagnosis not present

## 2021-04-03 DIAGNOSIS — E113511 Type 2 diabetes mellitus with proliferative diabetic retinopathy with macular edema, right eye: Secondary | ICD-10-CM | POA: Diagnosis not present

## 2021-04-10 DIAGNOSIS — E113412 Type 2 diabetes mellitus with severe nonproliferative diabetic retinopathy with macular edema, left eye: Secondary | ICD-10-CM | POA: Diagnosis not present

## 2021-05-08 DIAGNOSIS — E113511 Type 2 diabetes mellitus with proliferative diabetic retinopathy with macular edema, right eye: Secondary | ICD-10-CM | POA: Diagnosis not present

## 2021-05-15 DIAGNOSIS — E113312 Type 2 diabetes mellitus with moderate nonproliferative diabetic retinopathy with macular edema, left eye: Secondary | ICD-10-CM | POA: Diagnosis not present

## 2021-05-16 DIAGNOSIS — E11319 Type 2 diabetes mellitus with unspecified diabetic retinopathy without macular edema: Secondary | ICD-10-CM | POA: Diagnosis not present

## 2021-05-16 DIAGNOSIS — D509 Iron deficiency anemia, unspecified: Secondary | ICD-10-CM | POA: Diagnosis not present

## 2021-05-16 DIAGNOSIS — I10 Essential (primary) hypertension: Secondary | ICD-10-CM | POA: Diagnosis not present

## 2021-05-16 DIAGNOSIS — E785 Hyperlipidemia, unspecified: Secondary | ICD-10-CM | POA: Diagnosis not present

## 2021-05-16 DIAGNOSIS — F4321 Adjustment disorder with depressed mood: Secondary | ICD-10-CM | POA: Diagnosis not present

## 2021-05-16 DIAGNOSIS — E1165 Type 2 diabetes mellitus with hyperglycemia: Secondary | ICD-10-CM | POA: Diagnosis not present

## 2021-05-24 DIAGNOSIS — E1165 Type 2 diabetes mellitus with hyperglycemia: Secondary | ICD-10-CM | POA: Diagnosis not present

## 2021-05-24 DIAGNOSIS — N1831 Chronic kidney disease, stage 3a: Secondary | ICD-10-CM | POA: Diagnosis not present

## 2021-05-24 DIAGNOSIS — E785 Hyperlipidemia, unspecified: Secondary | ICD-10-CM | POA: Diagnosis not present

## 2021-05-24 DIAGNOSIS — I129 Hypertensive chronic kidney disease with stage 1 through stage 4 chronic kidney disease, or unspecified chronic kidney disease: Secondary | ICD-10-CM | POA: Diagnosis not present

## 2021-05-24 DIAGNOSIS — Z7984 Long term (current) use of oral hypoglycemic drugs: Secondary | ICD-10-CM | POA: Diagnosis not present

## 2021-05-27 DIAGNOSIS — H25813 Combined forms of age-related cataract, bilateral: Secondary | ICD-10-CM | POA: Diagnosis not present

## 2021-05-27 DIAGNOSIS — H31093 Other chorioretinal scars, bilateral: Secondary | ICD-10-CM | POA: Diagnosis not present

## 2021-05-27 DIAGNOSIS — E113511 Type 2 diabetes mellitus with proliferative diabetic retinopathy with macular edema, right eye: Secondary | ICD-10-CM | POA: Diagnosis not present

## 2021-05-27 DIAGNOSIS — H3582 Retinal ischemia: Secondary | ICD-10-CM | POA: Diagnosis not present

## 2021-05-27 DIAGNOSIS — E113312 Type 2 diabetes mellitus with moderate nonproliferative diabetic retinopathy with macular edema, left eye: Secondary | ICD-10-CM | POA: Diagnosis not present

## 2021-06-12 DIAGNOSIS — E113511 Type 2 diabetes mellitus with proliferative diabetic retinopathy with macular edema, right eye: Secondary | ICD-10-CM | POA: Diagnosis not present

## 2021-06-19 DIAGNOSIS — E1165 Type 2 diabetes mellitus with hyperglycemia: Secondary | ICD-10-CM | POA: Diagnosis not present

## 2021-06-19 DIAGNOSIS — E113312 Type 2 diabetes mellitus with moderate nonproliferative diabetic retinopathy with macular edema, left eye: Secondary | ICD-10-CM | POA: Diagnosis not present

## 2021-06-19 DIAGNOSIS — I10 Essential (primary) hypertension: Secondary | ICD-10-CM | POA: Diagnosis not present

## 2021-06-19 DIAGNOSIS — Z7984 Long term (current) use of oral hypoglycemic drugs: Secondary | ICD-10-CM | POA: Diagnosis not present

## 2021-06-27 DIAGNOSIS — E113511 Type 2 diabetes mellitus with proliferative diabetic retinopathy with macular edema, right eye: Secondary | ICD-10-CM | POA: Diagnosis not present

## 2021-07-16 DIAGNOSIS — Z7984 Long term (current) use of oral hypoglycemic drugs: Secondary | ICD-10-CM | POA: Diagnosis not present

## 2021-07-16 DIAGNOSIS — I10 Essential (primary) hypertension: Secondary | ICD-10-CM | POA: Diagnosis not present

## 2021-07-16 DIAGNOSIS — E1165 Type 2 diabetes mellitus with hyperglycemia: Secondary | ICD-10-CM | POA: Diagnosis not present

## 2021-07-16 DIAGNOSIS — Z91199 Patient's noncompliance with other medical treatment and regimen due to unspecified reason: Secondary | ICD-10-CM | POA: Diagnosis not present

## 2021-07-31 DIAGNOSIS — E113312 Type 2 diabetes mellitus with moderate nonproliferative diabetic retinopathy with macular edema, left eye: Secondary | ICD-10-CM | POA: Diagnosis not present

## 2021-08-21 DIAGNOSIS — H3582 Retinal ischemia: Secondary | ICD-10-CM | POA: Diagnosis not present

## 2021-08-21 DIAGNOSIS — H40003 Preglaucoma, unspecified, bilateral: Secondary | ICD-10-CM | POA: Diagnosis not present

## 2021-08-21 DIAGNOSIS — E113513 Type 2 diabetes mellitus with proliferative diabetic retinopathy with macular edema, bilateral: Secondary | ICD-10-CM | POA: Diagnosis not present

## 2021-08-21 DIAGNOSIS — H25813 Combined forms of age-related cataract, bilateral: Secondary | ICD-10-CM | POA: Diagnosis not present

## 2021-09-09 DIAGNOSIS — I10 Essential (primary) hypertension: Secondary | ICD-10-CM | POA: Diagnosis not present

## 2021-09-09 DIAGNOSIS — E1165 Type 2 diabetes mellitus with hyperglycemia: Secondary | ICD-10-CM | POA: Diagnosis not present

## 2021-09-09 DIAGNOSIS — N1831 Chronic kidney disease, stage 3a: Secondary | ICD-10-CM | POA: Diagnosis not present

## 2021-09-09 DIAGNOSIS — E785 Hyperlipidemia, unspecified: Secondary | ICD-10-CM | POA: Diagnosis not present

## 2021-09-11 DIAGNOSIS — E113312 Type 2 diabetes mellitus with moderate nonproliferative diabetic retinopathy with macular edema, left eye: Secondary | ICD-10-CM | POA: Diagnosis not present

## 2021-09-17 DIAGNOSIS — E113511 Type 2 diabetes mellitus with proliferative diabetic retinopathy with macular edema, right eye: Secondary | ICD-10-CM | POA: Diagnosis not present

## 2021-09-18 DIAGNOSIS — Z7984 Long term (current) use of oral hypoglycemic drugs: Secondary | ICD-10-CM | POA: Diagnosis not present

## 2021-09-18 DIAGNOSIS — E1165 Type 2 diabetes mellitus with hyperglycemia: Secondary | ICD-10-CM | POA: Diagnosis not present

## 2021-09-18 DIAGNOSIS — I10 Essential (primary) hypertension: Secondary | ICD-10-CM | POA: Diagnosis not present

## 2021-09-18 DIAGNOSIS — Z5329 Procedure and treatment not carried out because of patient's decision for other reasons: Secondary | ICD-10-CM | POA: Diagnosis not present

## 2021-10-29 DIAGNOSIS — E113511 Type 2 diabetes mellitus with proliferative diabetic retinopathy with macular edema, right eye: Secondary | ICD-10-CM | POA: Diagnosis not present

## 2021-10-30 DIAGNOSIS — E113512 Type 2 diabetes mellitus with proliferative diabetic retinopathy with macular edema, left eye: Secondary | ICD-10-CM | POA: Diagnosis not present

## 2021-11-18 DIAGNOSIS — H3582 Retinal ischemia: Secondary | ICD-10-CM | POA: Diagnosis not present

## 2021-11-18 DIAGNOSIS — H31093 Other chorioretinal scars, bilateral: Secondary | ICD-10-CM | POA: Diagnosis not present

## 2021-11-18 DIAGNOSIS — H40003 Preglaucoma, unspecified, bilateral: Secondary | ICD-10-CM | POA: Diagnosis not present

## 2021-11-18 DIAGNOSIS — E113513 Type 2 diabetes mellitus with proliferative diabetic retinopathy with macular edema, bilateral: Secondary | ICD-10-CM | POA: Diagnosis not present

## 2021-11-18 DIAGNOSIS — H25813 Combined forms of age-related cataract, bilateral: Secondary | ICD-10-CM | POA: Diagnosis not present

## 2021-11-18 DIAGNOSIS — H35033 Hypertensive retinopathy, bilateral: Secondary | ICD-10-CM | POA: Diagnosis not present

## 2021-12-02 DIAGNOSIS — E782 Mixed hyperlipidemia: Secondary | ICD-10-CM | POA: Diagnosis not present

## 2021-12-02 DIAGNOSIS — I10 Essential (primary) hypertension: Secondary | ICD-10-CM | POA: Diagnosis not present

## 2021-12-02 DIAGNOSIS — N1831 Chronic kidney disease, stage 3a: Secondary | ICD-10-CM | POA: Diagnosis not present

## 2021-12-02 DIAGNOSIS — E1165 Type 2 diabetes mellitus with hyperglycemia: Secondary | ICD-10-CM | POA: Diagnosis not present

## 2021-12-03 DIAGNOSIS — E113511 Type 2 diabetes mellitus with proliferative diabetic retinopathy with macular edema, right eye: Secondary | ICD-10-CM | POA: Diagnosis not present

## 2021-12-18 DIAGNOSIS — E11319 Type 2 diabetes mellitus with unspecified diabetic retinopathy without macular edema: Secondary | ICD-10-CM | POA: Diagnosis not present

## 2021-12-18 DIAGNOSIS — E782 Mixed hyperlipidemia: Secondary | ICD-10-CM | POA: Diagnosis not present

## 2021-12-18 DIAGNOSIS — F4321 Adjustment disorder with depressed mood: Secondary | ICD-10-CM | POA: Diagnosis not present

## 2021-12-18 DIAGNOSIS — E1165 Type 2 diabetes mellitus with hyperglycemia: Secondary | ICD-10-CM | POA: Diagnosis not present

## 2021-12-18 DIAGNOSIS — I10 Essential (primary) hypertension: Secondary | ICD-10-CM | POA: Diagnosis not present

## 2021-12-23 DIAGNOSIS — Z1231 Encounter for screening mammogram for malignant neoplasm of breast: Secondary | ICD-10-CM | POA: Diagnosis not present

## 2021-12-25 DIAGNOSIS — E113312 Type 2 diabetes mellitus with moderate nonproliferative diabetic retinopathy with macular edema, left eye: Secondary | ICD-10-CM | POA: Diagnosis not present

## 2022-01-14 DIAGNOSIS — E113511 Type 2 diabetes mellitus with proliferative diabetic retinopathy with macular edema, right eye: Secondary | ICD-10-CM | POA: Diagnosis not present

## 2022-01-30 DIAGNOSIS — Z23 Encounter for immunization: Secondary | ICD-10-CM | POA: Diagnosis not present

## 2022-02-13 DIAGNOSIS — E113312 Type 2 diabetes mellitus with moderate nonproliferative diabetic retinopathy with macular edema, left eye: Secondary | ICD-10-CM | POA: Diagnosis not present

## 2022-02-19 DIAGNOSIS — E1169 Type 2 diabetes mellitus with other specified complication: Secondary | ICD-10-CM | POA: Diagnosis not present

## 2022-02-19 DIAGNOSIS — E785 Hyperlipidemia, unspecified: Secondary | ICD-10-CM | POA: Diagnosis not present

## 2022-02-19 DIAGNOSIS — M25561 Pain in right knee: Secondary | ICD-10-CM | POA: Diagnosis not present

## 2022-02-19 DIAGNOSIS — E11319 Type 2 diabetes mellitus with unspecified diabetic retinopathy without macular edema: Secondary | ICD-10-CM | POA: Diagnosis not present

## 2022-02-19 DIAGNOSIS — I10 Essential (primary) hypertension: Secondary | ICD-10-CM | POA: Diagnosis not present

## 2022-02-26 DIAGNOSIS — E113511 Type 2 diabetes mellitus with proliferative diabetic retinopathy with macular edema, right eye: Secondary | ICD-10-CM | POA: Diagnosis not present

## 2022-02-28 DIAGNOSIS — H25013 Cortical age-related cataract, bilateral: Secondary | ICD-10-CM | POA: Diagnosis not present

## 2022-02-28 DIAGNOSIS — H25043 Posterior subcapsular polar age-related cataract, bilateral: Secondary | ICD-10-CM | POA: Diagnosis not present

## 2022-02-28 DIAGNOSIS — H524 Presbyopia: Secondary | ICD-10-CM | POA: Diagnosis not present

## 2022-02-28 DIAGNOSIS — H2513 Age-related nuclear cataract, bilateral: Secondary | ICD-10-CM | POA: Diagnosis not present

## 2022-03-06 ENCOUNTER — Other Ambulatory Visit: Payer: Self-pay

## 2022-03-06 ENCOUNTER — Ambulatory Visit: Payer: Medicare PPO | Attending: Family Medicine

## 2022-03-06 DIAGNOSIS — R2681 Unsteadiness on feet: Secondary | ICD-10-CM | POA: Diagnosis not present

## 2022-03-06 DIAGNOSIS — M25561 Pain in right knee: Secondary | ICD-10-CM | POA: Diagnosis not present

## 2022-03-06 DIAGNOSIS — G8929 Other chronic pain: Secondary | ICD-10-CM | POA: Insufficient documentation

## 2022-03-06 DIAGNOSIS — M25661 Stiffness of right knee, not elsewhere classified: Secondary | ICD-10-CM | POA: Diagnosis not present

## 2022-03-06 NOTE — Therapy (Signed)
OUTPATIENT PHYSICAL THERAPY LOWER EXTREMITY EVALUATION   Patient Name: Alexandra Jensen MRN: [id number] DOB:Jul 18, 1951, 71 y.o., female Today's Date: 03/06/2022    Past Medical History:  Diagnosis Date   Allergic rhinitis, cause unspecified    Cardiomegaly    Nontoxic multinodular goiter    Obesity, unspecified    Obstructive sleep apnea (adult) (pediatric)    Other and unspecified hyperlipidemia    Type II or unspecified type diabetes mellitus with ophthalmic manifestations, not stated as uncontrolled(250.50)    Type II or unspecified type diabetes mellitus without mention of complication, not stated as uncontrolled    Unspecified essential hypertension    Unspecified glaucoma(365.9)    Past Surgical History:  Procedure Laterality Date   fibroid cyst left breast     TONSILLECTOMY     VAGINAL HYSTERECTOMY     Patient Active Problem List   Diagnosis Date Noted   ALLERGIC RHINITIS 10/17/2010   COUGH, CHRONIC 10/17/2010   DIABETES, TYPE 2 10/16/2010   DIABETIC  RETINOPATHY 10/16/2010   HYPERLIPIDEMIA 10/16/2010   OBESITY 10/16/2010   OBSTRUCTIVE SLEEP APNEA 10/16/2010   GLAUCOMA 10/16/2010   HYPERTENSION 10/16/2010   CARDIOMEGALY, MILD 10/16/2010   GOITER, MULTINODULAR 04/18/2004    REFERRING PROVIDER: Koren Shiver, DO  REFERRING DIAG: Pain in right knee  THERAPY DIAG:  No diagnosis found.  Rationale for Evaluation and Treatment Rehabilitation  ONSET DATE: "quite a while"   SUBJECTIVE:   SUBJECTIVE STATEMENT: Patient feels that her right knee has been bothering her for "quite a while." She feels that it has been getting worse since it began. She began to experience numbness in both toes and it has been slowly progressing up her leg. She had a fall in 2021 when she tried to step up with her right leg first. However, she did not go to the hospital. She has noticed that her right leg will start "jerking" if she stands for long periods (about 10 minutes) and  she feels like she will fall. She has begun to limit her mobility outside her house because she is afraid of falling if she is not with someone. She had tried using Voltaren gel, but this has not helped any.   PERTINENT HISTORY: Vision impairments  PAIN:  Are you having pain? Yes: NPRS scale: 3-4/10 Pain location: right leg; can radiate from right thigh into right lower leg Pain description: aching  Aggravating factors: standing, walking Relieving factors: sitting  PRECAUTIONS: Fall  WEIGHT BEARING RESTRICTIONS No  FALLS:  Has patient fallen in last 6 months?  No falls in the past year, but multiple falls prior.   LIVING ENVIRONMENT: Lives with: lives with their family Lives in: House/apartment Stairs: Yes: External: 2 steps; on left going up, none, and Left of her front steps, but none on her back steps Has following equipment at home: None  NEXT MD FOLLOW UP: 03/24/22  OCCUPATION: retired  PLOF: Independent  PATIENT GOALS feel safe walking, go to the gym and exercise (she does not feel safe at this time), get stronger    OBJECTIVE:  COGNITION:  Overall cognitive status: Within functional limits for tasks assessed     SENSATION: Light touch: Impaired  and patient reported numbness in her left toes and right leg. Slightly diminished sensation in right great toe and medial foot.  PALPATION: Unable to assess due to significant subjective history  LOWER EXTREMITY ROM:  Active ROM Right eval Left eval  Hip flexion    Hip extension  Hip abduction    Hip adduction    Hip internal rotation    Hip external rotation    Knee flexion 95 Measured in sitting  109 Measured in sitting  Knee extension [facility] WFL  Ankle dorsiflexion Approximately 5 degrees  Approximately 5 degrees  Ankle plantarflexion    Ankle inversion    Ankle eversion     (Blank rows = not tested)  LOWER EXTREMITY MMT:  MMT Right eval Left eval  Hip flexion 4-/5 4-/5  Hip extension    Hip  abduction    Hip adduction    Hip internal rotation    Hip external rotation    Knee flexion 4/5 4/5  Knee extension 4-/5 4/5  Ankle dorsiflexion 4/5 4/5  Ankle plantarflexion    Ankle inversion    Ankle eversion     (Blank rows = not tested)  LOWER EXTREMITY SPECIAL TESTS:  Unable to assess due to significant subjective history  FUNCTIONAL TESTS:  5 times sit to stand: 23.15 seconds without UE support  GAIT: Distance walked: 28 feet Assistive device utilized: None Level of assistance: Complete Independence Comments: reduced heel strike and toe off bilaterally, reduced stride length Gait speed: 0.72 m/s    TODAY'S TREATMENT:    PATIENT EDUCATION:  Education details: POC, prognosis, contrast baths Person educated: Patient Education method: Explanation Education comprehension: verbalized understanding   HOME EXERCISE PROGRAM: Contrast baths: verbally explained with patient reported understand; handout declined  ASSESSMENT:  CLINICAL IMPRESSION: Patient is a 71 y.o. female who was seen today for physical therapy evaluation and treatment for right knee instability and a fear of falling. She is at an elevated risk of falling as evidenced by her reduced gait speed, five time sit to stand time, and history of falls. Further balance assessments will be performed at follow up appointments due to her significant subjective history. Recommend that she continue with skilled physical therapy to address her remaining impairments to maximize her safety and functional mobility.    OBJECTIVE IMPAIRMENTS Abnormal gait, decreased activity tolerance, decreased balance, decreased mobility, difficulty walking, decreased ROM, decreased strength, impaired perceived functional ability, impaired sensation, and pain.   ACTIVITY LIMITATIONS standing, squatting, stairs, transfers, and locomotion level  PARTICIPATION LIMITATIONS: cleaning, laundry, shopping, community activity, and yard  work  PERSONAL FACTORS Behavior pattern, Time since onset of injury/illness/exacerbation, and 3+ comorbidities: HTN, DM, and obesity  are also affecting patient's functional outcome.   REHAB POTENTIAL: Fair    CLINICAL DECISION MAKING: Evolving/moderate complexity  EVALUATION COMPLEXITY: Moderate   GOALS: Goals reviewed with patient? Yes  SHORT TERM GOALS: Target date: 03/27/2022  Patient will be independent with her initial HEP.  Baseline: Goal status: INITIAL  2.  Patient will be able to complete her daily activities without her right knee pain exceeding 2/10. Baseline:  Goal status: INITIAL  LONG TERM GOALS: Target date: 04/17/2022   Patient will be independent with her advanced HEP.  Baseline:  Goal status: INITIAL  2.  Patient will be able to safely navigate at least 3 steps with a reciprocal pattern for improved household mobility.  Baseline:  Goal status: INITIAL  3.  Patient will improve her five time sit to stand time to 13 seconds or less.  Baseline:  Goal status: INITIAL  4.  Patient will improve her gait speed to at least 1.0 m/s for improved safety and functional mobility.  Baseline:  Goal status: INITIAL   PLAN: PT FREQUENCY: 1-2x/week  PT DURATION: 6 weeks  PLANNED  INTERVENTIONS: Therapeutic exercises, Therapeutic activity, Neuromuscular re-education, Balance training, Gait training, Patient/Family education, Self Care, Joint mobilization, Stair training, Electrical stimulation, Cryotherapy, Moist heat, Vasopneumatic device, Contrast bath, Manual therapy, and Re-evaluation  PLAN FOR NEXT SESSION: nustep, lower extremity strengthening and balance interventions, and modalities as needed   Granville Lewis, PT 03/06/2022, 8:07 AM

## 2022-03-10 ENCOUNTER — Ambulatory Visit: Payer: Medicare PPO

## 2022-03-10 DIAGNOSIS — M25561 Pain in right knee: Secondary | ICD-10-CM | POA: Diagnosis not present

## 2022-03-10 DIAGNOSIS — R2681 Unsteadiness on feet: Secondary | ICD-10-CM

## 2022-03-10 DIAGNOSIS — M25661 Stiffness of right knee, not elsewhere classified: Secondary | ICD-10-CM | POA: Diagnosis not present

## 2022-03-10 DIAGNOSIS — G8929 Other chronic pain: Secondary | ICD-10-CM | POA: Diagnosis not present

## 2022-03-10 NOTE — Therapy (Signed)
OUTPATIENT PHYSICAL THERAPY LOWER EXTREMITY TREATMENT   Patient Name: Alexandra Jensen MRN: [id number] DOB:12-Dec-1950, 71 y.o., female Today's Date: 03/10/2022   PT End of Session - 03/10/22 0820     Visit Number 2    Number of Visits 12    Date for PT Re-Evaluation 05/16/22    PT Start Time 0818    PT Stop Time 0905    PT Time Calculation (min) 47 min    Activity Tolerance Patient tolerated treatment well    Behavior During Therapy WFL for tasks assessed/performed             Past Medical History:  Diagnosis Date   Allergic rhinitis, cause unspecified    Cardiomegaly    Nontoxic multinodular goiter    Obesity, unspecified    Obstructive sleep apnea (adult) (pediatric)    Other and unspecified hyperlipidemia    Type II or unspecified type diabetes mellitus with ophthalmic manifestations, not stated as uncontrolled(250.50)    Type II or unspecified type diabetes mellitus without mention of complication, not stated as uncontrolled    Unspecified essential hypertension    Unspecified glaucoma(365.9)    Past Surgical History:  Procedure Laterality Date   fibroid cyst left breast     TONSILLECTOMY     VAGINAL HYSTERECTOMY     Patient Active Problem List   Diagnosis Date Noted   ALLERGIC RHINITIS 10/17/2010   COUGH, CHRONIC 10/17/2010   DIABETES, TYPE 2 10/16/2010   DIABETIC  RETINOPATHY 10/16/2010   HYPERLIPIDEMIA 10/16/2010   OBESITY 10/16/2010   OBSTRUCTIVE SLEEP APNEA 10/16/2010   GLAUCOMA 10/16/2010   HYPERTENSION 10/16/2010   CARDIOMEGALY, MILD 10/16/2010   GOITER, MULTINODULAR 04/18/2004    REFERRING PROVIDER: Koren Shiver, DO  REFERRING DIAG: Pain in right knee  THERAPY DIAG:  Unsteadiness on feet  Chronic pain of right knee  Stiffness of right knee, not elsewhere classified  Rationale for Evaluation and Treatment Rehabilitation  ONSET DATE: "quite a while"   SUBJECTIVE:   SUBJECTIVE STATEMENT: Patient reports that her knee is not  really hurting this morning, but it does not feel any different since her evaluation.   PERTINENT HISTORY: Vision impairments  PAIN:  Are you having pain? Yes: NPRS scale: 0/10 Pain location: right leg; can radiate from right thigh into right lower leg Pain description: aching  Aggravating factors: standing, walking Relieving factors: sitting  PRECAUTIONS: Fall  WEIGHT BEARING RESTRICTIONS No  FALLS:  Has patient fallen in last 6 months?  No falls in the past year, but multiple falls prior.   LIVING ENVIRONMENT: Lives with: lives with their family Lives in: House/apartment Stairs: Yes: External: 2 steps; on left going up, none, and Left of her front steps, but none on her back steps Has following equipment at home: None  NEXT MD FOLLOW UP: 03/24/22  OCCUPATION: retired  PLOF: Independent  PATIENT GOALS feel safe walking, go to the gym and exercise (she does not feel safe at this time), get stronger    OBJECTIVE: performed at initial evaluation on 7/20 COGNITION:  Overall cognitive status: Within functional limits for tasks assessed     SENSATION: Light touch: Impaired  and patient reported numbness in her left toes and right leg. Slightly diminished sensation in right great toe and medial foot.  PALPATION: Unable to assess due to significant subjective history  LOWER EXTREMITY ROM:   Active ROM Right eval Left eval  Hip flexion    Hip extension    Hip abduction  Hip adduction    Hip internal rotation    Hip external rotation    Knee flexion 95 Measured in sitting  109 Measured in sitting  Knee extension [facility] WFL  Ankle dorsiflexion Approximately 5 degrees  Approximately 5 degrees  Ankle plantarflexion    Ankle inversion    Ankle eversion     (Blank rows = not tested)  LOWER EXTREMITY MMT:  MMT Right eval Left eval  Hip flexion 4-/5 4-/5  Hip extension    Hip abduction    Hip adduction    Hip internal rotation    Hip external rotation    Knee  flexion 4/5 4/5  Knee extension 4-/5 4/5  Ankle dorsiflexion 4/5 4/5  Ankle plantarflexion    Ankle inversion    Ankle eversion     (Blank rows = not tested)  LOWER EXTREMITY SPECIAL TESTS:  Unable to assess due to significant subjective history  FUNCTIONAL TESTS:  5 times sit to stand: 23.15 seconds without UE support  GAIT: Distance walked: 28 feet Assistive device utilized: None Level of assistance: Complete Independence Comments: reduced heel strike and toe off bilaterally, reduced stride length Gait speed: 0.72 m/s    TODAY'S TREATMENT:                                   7/24 EXERCISE LOG  Exercise Repetitions and Resistance Comments  Nustep L4 x 15 minutes   Step up  6" step; alternating LE; 2 minutes   Rocker board 5 minutes   Marching on foam  30 reps each    Gastroc stretch RLE only; 3 x 30 seconds        Blank cell = exercise not performed today    PATIENT EDUCATION:  Education details: healing, POC, HEP Person educated: Patient Education method: Explanation Education comprehension: verbalized understanding   HOME EXERCISE PROGRAM: Gastroc stretch and heel / toe raises at counter  ASSESSMENT:  CLINICAL IMPRESSION: Patient was introduced to multiple new interventions for improved lower extremity strength and stability needed for improved safety with her daily activities. She required minimal cueing with today's new interventions for proper exercise performance. She reported that her knee felt tired upon the conclusion of treatment. She continues to require skilled physical therapy to address her remaining impairments to maximize her functional mobility and safety.    OBJECTIVE IMPAIRMENTS Abnormal gait, decreased activity tolerance, decreased balance, decreased mobility, difficulty walking, decreased ROM, decreased strength, impaired perceived functional ability, impaired sensation, and pain.   ACTIVITY LIMITATIONS standing, squatting, stairs, transfers,  and locomotion level  PARTICIPATION LIMITATIONS: cleaning, laundry, shopping, community activity, and yard work  PERSONAL FACTORS Behavior pattern, Time since onset of injury/illness/exacerbation, and 3+ comorbidities: HTN, DM, and obesity  are also affecting patient's functional outcome.   REHAB POTENTIAL: Fair    CLINICAL DECISION MAKING: Evolving/moderate complexity  EVALUATION COMPLEXITY: Moderate   GOALS: Goals reviewed with patient? Yes  SHORT TERM GOALS: Target date: 03/27/2022  Patient will be independent with her initial HEP.  Baseline: Goal status: INITIAL  2.  Patient will be able to complete her daily activities without her right knee pain exceeding 2/10. Baseline:  Goal status: INITIAL  LONG TERM GOALS: Target date: 04/17/2022   Patient will be independent with her advanced HEP.  Baseline:  Goal status: INITIAL  2.  Patient will be able to safely navigate at least 3 steps with a reciprocal pattern for  improved household mobility.  Baseline:  Goal status: INITIAL  3.  Patient will improve her five time sit to stand time to 13 seconds or less.  Baseline:  Goal status: INITIAL  4.  Patient will improve her gait speed to at least 1.0 m/s for improved safety and functional mobility.  Baseline:  Goal status: INITIAL   PLAN: PT FREQUENCY: 1-2x/week  PT DURATION: 6 weeks  PLANNED INTERVENTIONS: Therapeutic exercises, Therapeutic activity, Neuromuscular re-education, Balance training, Gait training, Patient/Family education, Self Care, Joint mobilization, Stair training, Electrical stimulation, Cryotherapy, Moist heat, Vasopneumatic device, Contrast bath, Manual therapy, and Re-evaluation  PLAN FOR NEXT SESSION: nustep, lower extremity strengthening and balance interventions, and modalities as needed   Granville Lewis, PT 03/10/2022, 10:35 AM

## 2022-03-12 ENCOUNTER — Ambulatory Visit: Payer: Medicare PPO

## 2022-03-12 DIAGNOSIS — M25561 Pain in right knee: Secondary | ICD-10-CM | POA: Diagnosis not present

## 2022-03-12 DIAGNOSIS — G8929 Other chronic pain: Secondary | ICD-10-CM

## 2022-03-12 DIAGNOSIS — R2681 Unsteadiness on feet: Secondary | ICD-10-CM | POA: Diagnosis not present

## 2022-03-12 DIAGNOSIS — M25661 Stiffness of right knee, not elsewhere classified: Secondary | ICD-10-CM

## 2022-03-12 NOTE — Therapy (Signed)
OUTPATIENT PHYSICAL THERAPY LOWER EXTREMITY TREATMENT   Patient Name: Alexandra Jensen MRN: [id number] DOB:1950-09-12, 71 y.o., female Today's Date: 03/12/2022   PT End of Session - 03/12/22 0822     Visit Number 3    Number of Visits 12    Date for PT Re-Evaluation 05/16/22    PT Start Time 0815    PT Stop Time 0859    PT Time Calculation (min) 44 min    Activity Tolerance Patient tolerated treatment well    Behavior During Therapy WFL for tasks assessed/performed              Past Medical History:  Diagnosis Date   Allergic rhinitis, cause unspecified    Cardiomegaly    Nontoxic multinodular goiter    Obesity, unspecified    Obstructive sleep apnea (adult) (pediatric)    Other and unspecified hyperlipidemia    Type II or unspecified type diabetes mellitus with ophthalmic manifestations, not stated as uncontrolled(250.50)    Type II or unspecified type diabetes mellitus without mention of complication, not stated as uncontrolled    Unspecified essential hypertension    Unspecified glaucoma(365.9)    Past Surgical History:  Procedure Laterality Date   fibroid cyst left breast     TONSILLECTOMY     VAGINAL HYSTERECTOMY     Patient Active Problem List   Diagnosis Date Noted   ALLERGIC RHINITIS 10/17/2010   COUGH, CHRONIC 10/17/2010   DIABETES, TYPE 2 10/16/2010   DIABETIC  RETINOPATHY 10/16/2010   HYPERLIPIDEMIA 10/16/2010   OBESITY 10/16/2010   OBSTRUCTIVE SLEEP APNEA 10/16/2010   GLAUCOMA 10/16/2010   HYPERTENSION 10/16/2010   CARDIOMEGALY, MILD 10/16/2010   GOITER, MULTINODULAR 04/18/2004    REFERRING PROVIDER: Koren Shiver, DO  REFERRING DIAG: Pain in right knee  THERAPY DIAG:  Unsteadiness on feet  Chronic pain of right knee  Stiffness of right knee, not elsewhere classified  Rationale for Evaluation and Treatment Rehabilitation  ONSET DATE: "quite a while"   SUBJECTIVE:   SUBJECTIVE STATEMENT: Patient reports that she feels  alright today. She feels that she may feel a little better since her last appointment.   PERTINENT HISTORY: Vision impairments  PAIN:  Are you having pain? Yes: NPRS scale: 0/10 Pain location: right leg; can radiate from right thigh into right lower leg Pain description: aching  Aggravating factors: standing, walking Relieving factors: sitting  PRECAUTIONS: Fall  WEIGHT BEARING RESTRICTIONS No  FALLS:  Has patient fallen in last 6 months?  No falls in the past year, but multiple falls prior.   LIVING ENVIRONMENT: Lives with: lives with their family Lives in: House/apartment Stairs: Yes: External: 2 steps; on left going up, none, and Left of her front steps, but none on her back steps Has following equipment at home: None  NEXT MD FOLLOW UP: 03/24/22  OCCUPATION: retired  PLOF: Independent  PATIENT GOALS feel safe walking, go to the gym and exercise (she does not feel safe at this time), get stronger    OBJECTIVE: performed at initial evaluation on 7/20 COGNITION:  Overall cognitive status: Within functional limits for tasks assessed     SENSATION: Light touch: Impaired  and patient reported numbness in her left toes and right leg. Slightly diminished sensation in right great toe and medial foot.  PALPATION: Unable to assess due to significant subjective history  LOWER EXTREMITY ROM:   Active ROM Right eval Left eval  Hip flexion    Hip extension    Hip abduction  Hip adduction    Hip internal rotation    Hip external rotation    Knee flexion 95 Measured in sitting  109 Measured in sitting  Knee extension [facility] WFL  Ankle dorsiflexion Approximately 5 degrees  Approximately 5 degrees  Ankle plantarflexion    Ankle inversion    Ankle eversion     (Blank rows = not tested)  LOWER EXTREMITY MMT:  MMT Right eval Left eval  Hip flexion 4-/5 4-/5  Hip extension    Hip abduction    Hip adduction    Hip internal rotation    Hip external rotation    Knee  flexion 4/5 4/5  Knee extension 4-/5 4/5  Ankle dorsiflexion 4/5 4/5  Ankle plantarflexion    Ankle inversion    Ankle eversion     (Blank rows = not tested)  LOWER EXTREMITY SPECIAL TESTS:  Unable to assess due to significant subjective history  FUNCTIONAL TESTS:  5 times sit to stand: 23.15 seconds without UE support  GAIT: Distance walked: 28 feet Assistive device utilized: None Level of assistance: Complete Independence Comments: reduced heel strike and toe off bilaterally, reduced stride length Gait speed: 0.72 m/s    TODAY'S TREATMENT:                                   7/26 EXERCISE LOG  Exercise Repetitions and Resistance Comments  Nustep  L4 x 15 minutes   Lateral step up and over 6" step; 2 minutes   Rocker board 5 minutes    Tandem balance  On foam; 4 x 30 seconds each 1 fingertip support  Side stepping  Red t-band at ankles; 30 reps each     Blank cell = exercise not performed today                                    7/24 EXERCISE LOG  Exercise Repetitions and Resistance Comments  Nustep L4 x 15 minutes   Step up  6" step; alternating LE; 2 minutes   Rocker board 5 minutes   Marching on foam  30 reps each    Gastroc stretch RLE only; 3 x 30 seconds        Blank cell = exercise not performed today    PATIENT EDUCATION:  Education details: healing, POC, HEP Person educated: Patient Education method: Explanation Education comprehension: verbalized understanding   HOME EXERCISE PROGRAM: Gastroc stretch and heel / toe raises at counter  ASSESSMENT:  CLINICAL IMPRESSION: Patient was introduced to lateral step ups and tandem balance on foam for improved lower extremity strength and stability needed for her functional activities. She required minimal cueing with side stepping for improved foot clearance to facilitate increased demand on her hip abductors. She reported no pain or discomfort with any of today's interventions. She reported feeling tired  upon conclusion of treatment. She continues to require skilled physical therapy to address her remaining impairments to return to her prior level of function.    OBJECTIVE IMPAIRMENTS Abnormal gait, decreased activity tolerance, decreased balance, decreased mobility, difficulty walking, decreased ROM, decreased strength, impaired perceived functional ability, impaired sensation, and pain.   ACTIVITY LIMITATIONS standing, squatting, stairs, transfers, and locomotion level  PARTICIPATION LIMITATIONS: cleaning, laundry, shopping, community activity, and yard work  PERSONAL FACTORS Behavior pattern, Time since onset of injury/illness/exacerbation, and 3+ comorbidities:  HTN, DM, and obesity  are also affecting patient's functional outcome.   REHAB POTENTIAL: Fair    CLINICAL DECISION MAKING: Evolving/moderate complexity  EVALUATION COMPLEXITY: Moderate   GOALS: Goals reviewed with patient? Yes  SHORT TERM GOALS: Target date: 03/27/2022  Patient will be independent with her initial HEP.  Baseline: Goal status: INITIAL  2.  Patient will be able to complete her daily activities without her right knee pain exceeding 2/10. Baseline:  Goal status: INITIAL  LONG TERM GOALS: Target date: 04/17/2022   Patient will be independent with her advanced HEP.  Baseline:  Goal status: INITIAL  2.  Patient will be able to safely navigate at least 3 steps with a reciprocal pattern for improved household mobility.  Baseline:  Goal status: INITIAL  3.  Patient will improve her five time sit to stand time to 13 seconds or less.  Baseline:  Goal status: INITIAL  4.  Patient will improve her gait speed to at least 1.0 m/s for improved safety and functional mobility.  Baseline:  Goal status: INITIAL   PLAN: PT FREQUENCY: 1-2x/week  PT DURATION: 6 weeks  PLANNED INTERVENTIONS: Therapeutic exercises, Therapeutic activity, Neuromuscular re-education, Balance training, Gait training, Patient/Family  education, Self Care, Joint mobilization, Stair training, Electrical stimulation, Cryotherapy, Moist heat, Vasopneumatic device, Contrast bath, Manual therapy, and Re-evaluation  PLAN FOR NEXT SESSION: nustep, lower extremity strengthening and balance interventions, and modalities as needed   Granville Lewis, PT 03/12/2022, 10:31 AM

## 2022-03-25 ENCOUNTER — Ambulatory Visit: Payer: Medicare PPO | Attending: Family Medicine

## 2022-03-25 DIAGNOSIS — M25661 Stiffness of right knee, not elsewhere classified: Secondary | ICD-10-CM

## 2022-03-25 DIAGNOSIS — R2681 Unsteadiness on feet: Secondary | ICD-10-CM | POA: Diagnosis not present

## 2022-03-25 DIAGNOSIS — M25561 Pain in right knee: Secondary | ICD-10-CM | POA: Diagnosis not present

## 2022-03-25 DIAGNOSIS — G8929 Other chronic pain: Secondary | ICD-10-CM

## 2022-03-25 NOTE — Therapy (Signed)
OUTPATIENT PHYSICAL THERAPY LOWER EXTREMITY TREATMENT   Patient Name: Alexandra Jensen MRN: [id number] DOB:Jan 26, 1951, 71 y.o., female Today's Date: 03/25/2022   PT End of Session - 03/25/22 0818     Visit Number 4    Number of Visits 12    Date for PT Re-Evaluation 05/16/22    PT Start Time 0818    PT Stop Time 0858    PT Time Calculation (min) 40 min    Activity Tolerance Patient tolerated treatment well    Behavior During Therapy WFL for tasks assessed/performed               Past Medical History:  Diagnosis Date   Allergic rhinitis, cause unspecified    Cardiomegaly    Nontoxic multinodular goiter    Obesity, unspecified    Obstructive sleep apnea (adult) (pediatric)    Other and unspecified hyperlipidemia    Type II or unspecified type diabetes mellitus with ophthalmic manifestations, not stated as uncontrolled(250.50)    Type II or unspecified type diabetes mellitus without mention of complication, not stated as uncontrolled    Unspecified essential hypertension    Unspecified glaucoma(365.9)    Past Surgical History:  Procedure Laterality Date   fibroid cyst left breast     TONSILLECTOMY     VAGINAL HYSTERECTOMY     Patient Active Problem List   Diagnosis Date Noted   ALLERGIC RHINITIS 10/17/2010   COUGH, CHRONIC 10/17/2010   DIABETES, TYPE 2 10/16/2010   DIABETIC  RETINOPATHY 10/16/2010   HYPERLIPIDEMIA 10/16/2010   OBESITY 10/16/2010   OBSTRUCTIVE SLEEP APNEA 10/16/2010   GLAUCOMA 10/16/2010   HYPERTENSION 10/16/2010   CARDIOMEGALY, MILD 10/16/2010   GOITER, MULTINODULAR 04/18/2004    REFERRING PROVIDER: Koren Shiver, DO  REFERRING DIAG: Pain in right knee  THERAPY DIAG:  Unsteadiness on feet  Chronic pain of right knee  Stiffness of right knee, not elsewhere classified  Rationale for Evaluation and Treatment Rehabilitation  ONSET DATE: "quite a while"   SUBJECTIVE:   SUBJECTIVE STATEMENT: Patient reports that she had a  muscle spasm on Saturday. However, she did not fall.   PERTINENT HISTORY: Vision impairments  PAIN:  Are you having pain? Yes: NPRS scale: 0/10 Pain location: right leg; can radiate from right thigh into right lower leg Pain description: aching  Aggravating factors: standing, walking Relieving factors: sitting  PRECAUTIONS: Fall  WEIGHT BEARING RESTRICTIONS No  FALLS:  Has patient fallen in last 6 months?  No falls in the past year, but multiple falls prior.   LIVING ENVIRONMENT: Lives with: lives with their family Lives in: House/apartment Stairs: Yes: External: 2 steps; on left going up, none, and Left of her front steps, but none on her back steps Has following equipment at home: None  NEXT MD FOLLOW UP: 03/24/22  OCCUPATION: retired  PLOF: Independent  PATIENT GOALS feel safe walking, go to the gym and exercise (she does not feel safe at this time), get stronger    OBJECTIVE: performed at initial evaluation on 7/20 unless otherwise noted   COGNITION:  Overall cognitive status: Within functional limits for tasks assessed     SENSATION: Light touch: Impaired  and patient reported numbness in her left toes and right leg. Slightly diminished sensation in right great toe and medial foot.  PALPATION: Unable to assess due to significant subjective history  LOWER EXTREMITY ROM:   Active ROM Right eval Left eval  Hip flexion    Hip extension    Hip abduction  Hip adduction    Hip internal rotation    Hip external rotation    Knee flexion 95 Measured in sitting  109 Measured in sitting  Knee extension [facility] WFL  Ankle dorsiflexion Approximately 5 degrees  Approximately 5 degrees  Ankle plantarflexion    Ankle inversion    Ankle eversion     (Blank rows = not tested)  LOWER EXTREMITY MMT:  MMT Right eval Left eval  Hip flexion 4-/5 4-/5  Hip extension    Hip abduction    Hip adduction    Hip internal rotation    Hip external rotation    Knee flexion  4/5 4/5  Knee extension 4-/5 4/5  Ankle dorsiflexion 4/5 4/5  Ankle plantarflexion    Ankle inversion    Ankle eversion     (Blank rows = not tested)  LOWER EXTREMITY SPECIAL TESTS:  Unable to assess due to significant subjective history  FUNCTIONAL TESTS:  5 times sit to stand: 23.15 seconds without UE support  GAIT: Distance walked: 28 feet Assistive device utilized: None Level of assistance: Complete Independence Comments: reduced heel strike and toe off bilaterally, reduced stride length Gait speed: 0.72 m/s    TODAY'S TREATMENT:                                   8/8 EXERCISE LOG  Exercise Repetitions and Resistance Comments  Nustep  L4 x 15 minutes   Rocker board 2.5 minutes   LAQ  20 reps each @ 4 lbs    Step down  20 reps each on 6" step    Sit to stand 15 reps without UE support   Standing hip flexion  20 reps each   Standing hip extension 20 reps each    Blank cell = exercise not performed today                                    7/26 EXERCISE LOG  Exercise Repetitions and Resistance Comments  Nustep  L4 x 15 minutes   Lateral step up and over 6" step; 2 minutes   Rocker board 5 minutes    Tandem balance  On foam; 4 x 30 seconds each 1 fingertip support  Side stepping  Red t-band at ankles; 30 reps each     Blank cell = exercise not performed today                                    7/24 EXERCISE LOG  Exercise Repetitions and Resistance Comments  Nustep L4 x 15 minutes   Step up  6" step; alternating LE; 2 minutes   Rocker board 5 minutes   Marching on foam  30 reps each    Gastroc stretch RLE only; 3 x 30 seconds        Blank cell = exercise not performed today    PATIENT EDUCATION:  Education details:walking program, expectations for soreness Person educated: Patient Education method: Explanation Education comprehension: verbalized understanding   HOME EXERCISE PROGRAM: Gastroc stretch and heel / toe raises at  counter  ASSESSMENT:  CLINICAL IMPRESSION: Patient was introduced to long arc quads and other new interventions for improved lower extremity strength needed for functional activities. She required minimal cueing with step downs  for a slow and controlled eccentric quadriceps engagement needed for descending steps. She reported feeling tired upon the conclusion of treatment. She continues to require skilled physical therapy to address her remaining impairments to return to her prior level of function.    OBJECTIVE IMPAIRMENTS Abnormal gait, decreased activity tolerance, decreased balance, decreased mobility, difficulty walking, decreased ROM, decreased strength, impaired perceived functional ability, impaired sensation, and pain.   ACTIVITY LIMITATIONS standing, squatting, stairs, transfers, and locomotion level  PARTICIPATION LIMITATIONS: cleaning, laundry, shopping, community activity, and yard work  PERSONAL FACTORS Behavior pattern, Time since onset of injury/illness/exacerbation, and 3+ comorbidities: HTN, DM, and obesity  are also affecting patient's functional outcome.   REHAB POTENTIAL: Fair    CLINICAL DECISION MAKING: Evolving/moderate complexity  EVALUATION COMPLEXITY: Moderate   GOALS: Goals reviewed with patient? Yes  SHORT TERM GOALS: Target date: 03/27/2022  Patient will be independent with her initial HEP.  Baseline: Goal status: INITIAL  2.  Patient will be able to complete her daily activities without her right knee pain exceeding 2/10. Baseline:  Goal status: INITIAL  LONG TERM GOALS: Target date: 04/17/2022   Patient will be independent with her advanced HEP.  Baseline:  Goal status: INITIAL  2.  Patient will be able to safely navigate at least 3 steps with a reciprocal pattern for improved household mobility.  Baseline:  Goal status: INITIAL  3.  Patient will improve her five time sit to stand time to 13 seconds or less.  Baseline:  Goal status:  INITIAL  4.  Patient will improve her gait speed to at least 1.0 m/s for improved safety and functional mobility.  Baseline:  Goal status: INITIAL   PLAN: PT FREQUENCY: 1-2x/week  PT DURATION: 6 weeks  PLANNED INTERVENTIONS: Therapeutic exercises, Therapeutic activity, Neuromuscular re-education, Balance training, Gait training, Patient/Family education, Self Care, Joint mobilization, Stair training, Electrical stimulation, Cryotherapy, Moist heat, Vasopneumatic device, Contrast bath, Manual therapy, and Re-evaluation  PLAN FOR NEXT SESSION: nustep, lower extremity strengthening and balance interventions, and modalities as needed   Granville Lewis, PT 03/25/2022, 9:00 AM

## 2022-03-31 ENCOUNTER — Ambulatory Visit: Payer: Medicare PPO

## 2022-03-31 DIAGNOSIS — M25661 Stiffness of right knee, not elsewhere classified: Secondary | ICD-10-CM

## 2022-03-31 DIAGNOSIS — M25561 Pain in right knee: Secondary | ICD-10-CM | POA: Diagnosis not present

## 2022-03-31 DIAGNOSIS — G8929 Other chronic pain: Secondary | ICD-10-CM

## 2022-03-31 DIAGNOSIS — R2681 Unsteadiness on feet: Secondary | ICD-10-CM

## 2022-03-31 NOTE — Therapy (Signed)
OUTPATIENT PHYSICAL THERAPY LOWER EXTREMITY TREATMENT   Patient Name: RYELEIGH Jensen MRN: [id number] DOB:Nov 01, 1950, 71 y.o., female Today's Date: 03/31/2022   PT End of Session - 03/31/22 0820     Visit Number 5    Number of Visits 12    Date for PT Re-Evaluation 05/16/22    PT Start Time 0815    PT Stop Time 0859    PT Time Calculation (min) 44 min    Activity Tolerance Patient tolerated treatment well    Behavior During Therapy WFL for tasks assessed/performed                Past Medical History:  Diagnosis Date   Allergic rhinitis, cause unspecified    Cardiomegaly    Nontoxic multinodular goiter    Obesity, unspecified    Obstructive sleep apnea (adult) (pediatric)    Other and unspecified hyperlipidemia    Type II or unspecified type diabetes mellitus with ophthalmic manifestations, not stated as uncontrolled(250.50)    Type II or unspecified type diabetes mellitus without mention of complication, not stated as uncontrolled    Unspecified essential hypertension    Unspecified glaucoma(365.9)    Past Surgical History:  Procedure Laterality Date   fibroid cyst left breast     TONSILLECTOMY     VAGINAL HYSTERECTOMY     Patient Active Problem List   Diagnosis Date Noted   ALLERGIC RHINITIS 10/17/2010   COUGH, CHRONIC 10/17/2010   DIABETES, TYPE 2 10/16/2010   DIABETIC  RETINOPATHY 10/16/2010   HYPERLIPIDEMIA 10/16/2010   OBESITY 10/16/2010   OBSTRUCTIVE SLEEP APNEA 10/16/2010   GLAUCOMA 10/16/2010   HYPERTENSION 10/16/2010   CARDIOMEGALY, MILD 10/16/2010   GOITER, MULTINODULAR 04/18/2004    REFERRING PROVIDER: Koren Shiver, DO  REFERRING DIAG: Pain in right knee  THERAPY DIAG:  Unsteadiness on feet  Chronic pain of right knee  Stiffness of right knee, not elsewhere classified  Rationale for Evaluation and Treatment Rehabilitation  ONSET DATE: "quite a while"   SUBJECTIVE:   SUBJECTIVE STATEMENT: Patient reports that she had a  really bad episode yesterday at church. She notes that this was the longest that it has happened. She is unsure what caused this flare up. She is still having some discomfort from yesterday.   PERTINENT HISTORY: Vision impairments  PAIN:  Are you having pain? Yes: NPRS scale: 0/10 Pain location: right leg; can radiate from right thigh into right lower leg Pain description: aching  Aggravating factors: standing, walking Relieving factors: sitting  PRECAUTIONS: Fall  WEIGHT BEARING RESTRICTIONS No  FALLS:  Has patient fallen in last 6 months?  No falls in the past year, but multiple falls prior.   LIVING ENVIRONMENT: Lives with: lives with their family Lives in: House/apartment Stairs: Yes: External: 2 steps; on left going up, none, and Left of her front steps, but none on her back steps Has following equipment at home: None  NEXT MD FOLLOW UP: 04/16/22  OCCUPATION: retired  PLOF: Independent  PATIENT GOALS feel safe walking, go to the gym and exercise (she does not feel safe at this time), get stronger    OBJECTIVE: performed at initial evaluation on 7/20 unless otherwise noted   COGNITION:  Overall cognitive status: Within functional limits for tasks assessed     SENSATION: Light touch: Impaired  and patient reported numbness in her left toes and right leg. Slightly diminished sensation in right great toe and medial foot.  PALPATION: Unable to assess due to significant subjective history  LOWER EXTREMITY ROM:   Active ROM Right eval Left eval  Hip flexion    Hip extension    Hip abduction    Hip adduction    Hip internal rotation    Hip external rotation    Knee flexion 95 Measured in sitting  109 Measured in sitting  Knee extension [facility] WFL  Ankle dorsiflexion Approximately 5 degrees  Approximately 5 degrees  Ankle plantarflexion    Ankle inversion    Ankle eversion     (Blank rows = not tested)  LOWER EXTREMITY MMT:  MMT Right eval Left eval  Hip  flexion 4-/5 4-/5  Hip extension    Hip abduction    Hip adduction    Hip internal rotation    Hip external rotation    Knee flexion 4/5 4/5  Knee extension 4-/5 4/5  Ankle dorsiflexion 4/5 4/5  Ankle plantarflexion    Ankle inversion    Ankle eversion     (Blank rows = not tested)  LOWER EXTREMITY SPECIAL TESTS:  Unable to assess due to significant subjective history  FUNCTIONAL TESTS:  5 times sit to stand: 23.15 seconds without UE support  GAIT: Distance walked: 28 feet Assistive device utilized: None Level of assistance: Complete Independence Comments: reduced heel strike and toe off bilaterally, reduced stride length Gait speed: 0.72 m/s    TODAY'S TREATMENT:                                   8/14 EXERCISE LOG  Exercise Repetitions and Resistance Comments  Nustep L4 x 16 minutes   Rocker board  2 minutes   Step up Onto BOSU (ball up) x 25 reps   LAQ 30 reps each @ 5 lbs    Standing hamstring curls 20 reps each   Standing gastroc stretch 3 x 30 seconds each   Seated clams Green t-band x 20 reps    Lateral step up and over 6" step x 10 reps    Blank cell = exercise not performed today                                    8/8 EXERCISE LOG  Exercise Repetitions and Resistance Comments  Nustep  L4 x 15 minutes   Rocker board 2.5 minutes   LAQ  20 reps each @ 4 lbs    Step down  20 reps each on 6" step    Sit to stand 15 reps without UE support   Standing hip flexion  20 reps each   Standing hip extension 20 reps each    Blank cell = exercise not performed today                                    7/26 EXERCISE LOG  Exercise Repetitions and Resistance Comments  Nustep  L4 x 15 minutes   Lateral step up and over 6" step; 2 minutes   Rocker board 5 minutes    Tandem balance  On foam; 4 x 30 seconds each 1 fingertip support  Side stepping  Red t-band at ankles; 30 reps each     Blank cell = exercise not performed today    PATIENT EDUCATION:  Education  details:walking program, expectations for soreness Person educated:  Patient Education method: Explanation Education comprehension: verbalized understanding   HOME EXERCISE PROGRAM: Gastroc stretch and heel / toe raises at counter  ASSESSMENT:  CLINICAL IMPRESSION: Patient was progressed with multiple new and familiar interventions with moderate difficulty. She required minimal cueing with standing hamstring curls to limit hip mobility with knee flexion. She experienced no pain or discomfort with any of today's interventions. She reported feeling tired upon the conclusion of treatment. She continues to require skilled physical therapy to address her remaining impairments to return to her prior level of function.    OBJECTIVE IMPAIRMENTS Abnormal gait, decreased activity tolerance, decreased balance, decreased mobility, difficulty walking, decreased ROM, decreased strength, impaired perceived functional ability, impaired sensation, and pain.   ACTIVITY LIMITATIONS standing, squatting, stairs, transfers, and locomotion level  PARTICIPATION LIMITATIONS: cleaning, laundry, shopping, community activity, and yard work  PERSONAL FACTORS Behavior pattern, Time since onset of injury/illness/exacerbation, and 3+ comorbidities: HTN, DM, and obesity  are also affecting patient's functional outcome.   REHAB POTENTIAL: Fair    CLINICAL DECISION MAKING: Evolving/moderate complexity  EVALUATION COMPLEXITY: Moderate   GOALS: Goals reviewed with patient? Yes  SHORT TERM GOALS: Target date: 03/27/2022  Patient will be independent with her initial HEP.  Baseline: Goal status: INITIAL  2.  Patient will be able to complete her daily activities without her right knee pain exceeding 2/10. Baseline:  Goal status: INITIAL  LONG TERM GOALS: Target date: 04/17/2022   Patient will be independent with her advanced HEP.  Baseline:  Goal status: INITIAL  2.  Patient will be able to safely navigate at  least 3 steps with a reciprocal pattern for improved household mobility.  Baseline:  Goal status: INITIAL  3.  Patient will improve her five time sit to stand time to 13 seconds or less.  Baseline:  Goal status: INITIAL  4.  Patient will improve her gait speed to at least 1.0 m/s for improved safety and functional mobility.  Baseline:  Goal status: INITIAL   PLAN: PT FREQUENCY: 1-2x/week  PT DURATION: 6 weeks  PLANNED INTERVENTIONS: Therapeutic exercises, Therapeutic activity, Neuromuscular re-education, Balance training, Gait training, Patient/Family education, Self Care, Joint mobilization, Stair training, Electrical stimulation, Cryotherapy, Moist heat, Vasopneumatic device, Contrast bath, Manual therapy, and Re-evaluation  PLAN FOR NEXT SESSION: nustep, lower extremity strengthening and balance interventions, and modalities as needed   Granville Lewis, PT 03/31/2022, 10:03 AM

## 2022-04-01 ENCOUNTER — Other Ambulatory Visit: Payer: Self-pay | Admitting: *Deleted

## 2022-04-01 ENCOUNTER — Encounter: Payer: Self-pay | Admitting: *Deleted

## 2022-04-01 NOTE — Patient Outreach (Signed)
  Care Coordination   Initial Visit Note   04/01/2022 Name: Alexandra Jensen MRN: [id number] DOB: [birth year]  Alexandra Jensen is a 71 y.o. year old female who sees Fulp, Hewitt Shorts, MD for primary care. I spoke with  Alexandra Jensen by phone today  What matters to the patients health and wellness today?  No needs    Goals Addressed               This Visit's Progress     No needs (pt-stated)        Care Coordination Interventions: Reviewed medications with patient and discussed Purpose of all medications Reviewed scheduled/upcoming provider appointments including pending appointments and verified AWV is on 04/16/2022 with primary provider Screening for signs and symptoms of depression related to chronic disease state  Assessed social determinant of health barriers          SDOH assessments and interventions completed:  Yes  SDOH Interventions Today    Flowsheet Row Most Recent Value  SDOH Interventions   Food Insecurity Interventions Intervention Not Indicated  Housing Interventions Intervention Not Indicated  Transportation Interventions Intervention Not Indicated        Care Coordination Interventions Activated:  Yes  Care Coordination Interventions:  Yes, provided   Follow up plan: No further intervention required.   Encounter Outcome:  Pt. Visit Completed   Alexandra Cousin, RN Care Management Coordinator [facility] Main Office [phone]

## 2022-04-01 NOTE — Patient Instructions (Signed)
Visit Information  Thank you for taking time to visit with me today. Please don't hesitate to contact me if I can be of assistance to you.   Following are the goals we discussed today:   Goals Addressed               This Visit's Progress     No needs (pt-stated)        Care Coordination Interventions: Reviewed medications with patient and discussed Purpose of all medications Reviewed scheduled/upcoming provider appointments including pending appointments and verified AWV is on 04/16/2022 with primary provider Screening for signs and symptoms of depression related to chronic disease state  Assessed social determinant of health barriers          Please call the care guide team at [phone] if you need to cancel or reschedule your appointment.   If you are experiencing a Mental Health or Behavioral Health Crisis or need someone to talk to, please call the Suicide and Crisis Lifeline: 988  The patient verbalized understanding of instructions, educational materials, and care plan provided today and DECLINED offer to receive copy of patient instructions, educational materials, and care plan.   No further follow up required: no needs at this time.  Elliot Cousin, RN Care Management Coordinator [facility] Main Office [phone]

## 2022-04-02 DIAGNOSIS — E113312 Type 2 diabetes mellitus with moderate nonproliferative diabetic retinopathy with macular edema, left eye: Secondary | ICD-10-CM | POA: Diagnosis not present

## 2022-04-07 ENCOUNTER — Ambulatory Visit: Payer: Medicare PPO

## 2022-04-07 DIAGNOSIS — G8929 Other chronic pain: Secondary | ICD-10-CM | POA: Diagnosis not present

## 2022-04-07 DIAGNOSIS — M25661 Stiffness of right knee, not elsewhere classified: Secondary | ICD-10-CM

## 2022-04-07 DIAGNOSIS — M25561 Pain in right knee: Secondary | ICD-10-CM | POA: Diagnosis not present

## 2022-04-07 DIAGNOSIS — R2681 Unsteadiness on feet: Secondary | ICD-10-CM | POA: Diagnosis not present

## 2022-04-07 NOTE — Therapy (Addendum)
OUTPATIENT PHYSICAL THERAPY LOWER EXTREMITY TREATMENT   Patient Name: Alexandra Jensen MRN: [id number] DOB:[birth year], 71 y.o., female Today's Date: 04/07/2022   PT End of Session - 04/07/22 0822     Visit Number 6    Number of Visits 12    Date for PT Re-Evaluation 05/16/22    PT Start Time 0820    PT Stop Time 0907    PT Time Calculation (min) 47 min    Activity Tolerance Patient tolerated treatment well    Behavior During Therapy WFL for tasks assessed/performed                 Past Medical History:  Diagnosis Date   Allergic rhinitis, cause unspecified    Cardiomegaly    Nontoxic multinodular goiter    Obesity, unspecified    Obstructive sleep apnea (adult) (pediatric)    Other and unspecified hyperlipidemia    Type II or unspecified type diabetes mellitus with ophthalmic manifestations, not stated as uncontrolled(250.50)    Type II or unspecified type diabetes mellitus without mention of complication, not stated as uncontrolled    Unspecified essential hypertension    Unspecified glaucoma(365.9)    Past Surgical History:  Procedure Laterality Date   fibroid cyst left breast     TONSILLECTOMY     VAGINAL HYSTERECTOMY     Patient Active Problem List   Diagnosis Date Noted   ALLERGIC RHINITIS 10/17/2010   COUGH, CHRONIC 10/17/2010   DIABETES, TYPE 2 10/16/2010   DIABETIC  RETINOPATHY 10/16/2010   HYPERLIPIDEMIA 10/16/2010   OBESITY 10/16/2010   OBSTRUCTIVE SLEEP APNEA 10/16/2010   GLAUCOMA 10/16/2010   HYPERTENSION 10/16/2010   CARDIOMEGALY, MILD 10/16/2010   GOITER, MULTINODULAR 04/18/2004    REFERRING PROVIDER: Lyman Bishop, DO  REFERRING DIAG: Pain in right knee  THERAPY DIAG:  Unsteadiness on feet  Chronic pain of right knee  Stiffness of right knee, not elsewhere classified  Rationale for Evaluation and Treatment Rehabilitation  ONSET DATE: "quite a while"   SUBJECTIVE:   SUBJECTIVE STATEMENT: Patient reports that the  outside of her right knee is still hurting. She also notes that her lower leg and foot stay cold and numb. She feels that she has gotten better since since she started therapy, but she feels that it is not 100%. The biggest improvement she has noticed is that she can walk longer.   PERTINENT HISTORY: Vision impairments  PAIN:  Are you having pain? Yes: NPRS scale: 0/10 Pain location: right leg; can radiate from right thigh into right lower leg Pain description: aching  Aggravating factors: standing, walking Relieving factors: sitting  PRECAUTIONS: Fall  WEIGHT BEARING RESTRICTIONS No  FALLS:  Has patient fallen in last 6 months?  No falls in the past year, but multiple falls prior.   LIVING ENVIRONMENT: Lives with: lives with their family Lives in: House/apartment Stairs: Yes: External: 2 steps; on left going up, none, and Left of her front steps, but none on her back steps Has following equipment at home: None  NEXT MD FOLLOW UP: 04/16/22  OCCUPATION: retired  PLOF: Independent  PATIENT GOALS feel safe walking, go to the gym and exercise (she does not feel safe at this time), get stronger    OBJECTIVE: performed at initial evaluation on 7/20 unless otherwise noted   COGNITION:  Overall cognitive status: Within functional limits for tasks assessed     SENSATION: Light touch: Impaired  and patient reported numbness in her left toes and right leg. Slightly  diminished sensation in right great toe and medial foot.  PALPATION: Unable to assess due to significant subjective history  LOWER EXTREMITY ROM:   Active ROM Right eval Left eval  Hip flexion    Hip extension    Hip abduction    Hip adduction    Hip internal rotation    Hip external rotation    Knee flexion 95 Measured in sitting  109 Measured in sitting  Knee extension [facility] WFL  Ankle dorsiflexion Approximately 5 degrees  Approximately 5 degrees  Ankle plantarflexion    Ankle inversion    Ankle eversion      (Blank rows = not tested)  LOWER EXTREMITY MMT:  MMT Right eval Left eval  Hip flexion 4-/5 4-/5  Hip extension    Hip abduction    Hip adduction    Hip internal rotation    Hip external rotation    Knee flexion 4/5 4/5  Knee extension 4-/5 4/5  Ankle dorsiflexion 4/5 4/5  Ankle plantarflexion    Ankle inversion    Ankle eversion     (Blank rows = not tested)  LOWER EXTREMITY SPECIAL TESTS:  Unable to assess due to significant subjective history  FUNCTIONAL TESTS:  5 times sit to stand: 23.15 seconds without UE support  GAIT: Distance walked: 28 feet Assistive device utilized: None Level of assistance: Complete Independence Comments: reduced heel strike and toe off bilaterally, reduced stride length Gait speed: 0.72 m/s Gait speed on 8/21: 0.54 m/s   TODAY'S TREATMENT:                                   8/21 EXERCISE LOG  Exercise Repetitions and Resistance Comments  Nustep  L4 x 18 minutes   Rocker board 2 minutes   LAQ  30 reps each @ 5 lbs   Standing marching  20 reps each @ 5 lbs   Seated hamstring curls 20 reps w/ green t-band        Blank cell = exercise not performed today                                    8/14 EXERCISE LOG  Exercise Repetitions and Resistance Comments  Nustep L4 x 16 minutes   Rocker board  2 minutes   Step up Onto BOSU (ball up) x 25 reps   LAQ 30 reps each @ 5 lbs    Standing hamstring curls 20 reps each   Standing gastroc stretch 3 x 30 seconds each   Seated clams Green t-band x 20 reps    Lateral step up and over 6" step x 10 reps    Blank cell = exercise not performed today                                    8/8 EXERCISE LOG  Exercise Repetitions and Resistance Comments  Nustep  L4 x 15 minutes   Rocker board 2.5 minutes   LAQ  20 reps each @ 4 lbs    Step down  20 reps each on 6" step    Sit to stand 15 reps without UE support   Standing hip flexion  20 reps each   Standing hip extension 20 reps each    Blank  cell = exercise not performed today    PATIENT EDUCATION:  Education details:walking program, expectations for soreness Person educated: Patient Education method: Explanation Education comprehension: verbalized understanding   HOME EXERCISE PROGRAM: Gastroc stretch and heel / toe raises at counter  ASSESSMENT:  CLINICAL IMPRESSION: Patient is making fair progress with skilled physical therapy as evidenced by her subjective reports, objective measures, and functional mobility. She was unable to demonstrate a significant improvement in her gait speed or five time sit to stand time since her initial evaluation on 03/06/22. However, she has noticed that she has been able to walk longer and is more independent with her daily activities. Treatment focused on familiar interventions for improved lower extremity strength with moderate difficulty. She reported feeling feeling fatigued upon the conclusion of treatment. She may benefit from additional medical evaluation in addition to physical therapy to maximize her functional mobility.   PHYSICAL THERAPY DISCHARGE SUMMARY  Visits from Start of Care: 6  Current functional level related to goals / functional outcomes: Patient was unable to make significant progress with skilled physical therapy.    Remaining deficits: Pain, power, and gait deviations   Education / Equipment: HEP   Patient agrees to discharge. Patient goals were not met. Patient is being discharged due to not returning since the last visit.   OBJECTIVE IMPAIRMENTS Abnormal gait, decreased activity tolerance, decreased balance, decreased mobility, difficulty walking, decreased ROM, decreased strength, impaired perceived functional ability, impaired sensation, and pain.   ACTIVITY LIMITATIONS standing, squatting, stairs, transfers, and locomotion level  PARTICIPATION LIMITATIONS: cleaning, laundry, shopping, community activity, and yard work  PERSONAL FACTORS Behavior pattern,  Time since onset of injury/illness/exacerbation, and 3+ comorbidities: HTN, DM, and obesity  are also affecting patient's functional outcome.   REHAB POTENTIAL: Fair    CLINICAL DECISION MAKING: Evolving/moderate complexity  EVALUATION COMPLEXITY: Moderate   GOALS: Goals reviewed with patient? Yes  SHORT TERM GOALS: Target date: 03/27/2022  Patient will be independent with her initial HEP.  Baseline: Goal status: MET  2.  Patient will be able to complete her daily activities without her right knee pain exceeding 2/10. Baseline:  Goal status: IN PROGRESS  LONG TERM GOALS: Target date: 04/17/2022   Patient will be independent with her advanced HEP.  Baseline:  Goal status: IN PROGRESS  2.  Patient will be able to safely navigate at least 3 steps with a reciprocal pattern for improved household mobility.  Baseline: continues to utilize step to pattern with LLE leading  Goal status: IN PROGRESS  3.  Patient will improve her five time sit to stand time to 13 seconds or less.  Baseline: 24.68 on 04/07/22 Goal status: IN PROGRESS  4.  Patient will improve her gait speed to at least 1.0 m/s for improved safety and functional mobility.  Baseline: 0.54 m/s on 04/07/22 Goal status: IN PROGRESS   PLAN: PT FREQUENCY: 1-2x/week  PT DURATION: 6 weeks  PLANNED INTERVENTIONS: Therapeutic exercises, Therapeutic activity, Neuromuscular re-education, Balance training, Gait training, Patient/Family education, Self Care, Joint mobilization, Stair training, Electrical stimulation, Cryotherapy, Moist heat, Vasopneumatic device, Contrast bath, Manual therapy, and Re-evaluation  PLAN FOR NEXT SESSION: nustep, lower extremity strengthening and balance interventions, and modalities as needed   Darlin Coco, PT 04/07/2022, 1:09 PM

## 2022-04-09 DIAGNOSIS — E113511 Type 2 diabetes mellitus with proliferative diabetic retinopathy with macular edema, right eye: Secondary | ICD-10-CM | POA: Diagnosis not present

## 2022-04-16 DIAGNOSIS — Z1231 Encounter for screening mammogram for malignant neoplasm of breast: Secondary | ICD-10-CM | POA: Diagnosis not present

## 2022-04-16 DIAGNOSIS — N1831 Chronic kidney disease, stage 3a: Secondary | ICD-10-CM | POA: Diagnosis not present

## 2022-04-16 DIAGNOSIS — I10 Essential (primary) hypertension: Secondary | ICD-10-CM | POA: Diagnosis not present

## 2022-04-16 DIAGNOSIS — E785 Hyperlipidemia, unspecified: Secondary | ICD-10-CM | POA: Diagnosis not present

## 2022-04-16 DIAGNOSIS — G4733 Obstructive sleep apnea (adult) (pediatric): Secondary | ICD-10-CM | POA: Diagnosis not present

## 2022-04-16 DIAGNOSIS — E1165 Type 2 diabetes mellitus with hyperglycemia: Secondary | ICD-10-CM | POA: Diagnosis not present

## 2022-04-16 DIAGNOSIS — Z86010 Personal history of colonic polyps: Secondary | ICD-10-CM | POA: Diagnosis not present

## 2022-04-16 DIAGNOSIS — Z1211 Encounter for screening for malignant neoplasm of colon: Secondary | ICD-10-CM | POA: Diagnosis not present

## 2022-04-16 DIAGNOSIS — Z0000 Encounter for general adult medical examination without abnormal findings: Secondary | ICD-10-CM | POA: Diagnosis not present

## 2022-05-01 DIAGNOSIS — H25812 Combined forms of age-related cataract, left eye: Secondary | ICD-10-CM | POA: Diagnosis not present

## 2022-05-01 DIAGNOSIS — H2512 Age-related nuclear cataract, left eye: Secondary | ICD-10-CM | POA: Diagnosis not present

## 2022-05-01 DIAGNOSIS — H269 Unspecified cataract: Secondary | ICD-10-CM | POA: Diagnosis not present

## 2022-05-01 DIAGNOSIS — H25012 Cortical age-related cataract, left eye: Secondary | ICD-10-CM | POA: Diagnosis not present

## 2022-05-01 DIAGNOSIS — H25042 Posterior subcapsular polar age-related cataract, left eye: Secondary | ICD-10-CM | POA: Diagnosis not present

## 2022-05-21 DIAGNOSIS — E113511 Type 2 diabetes mellitus with proliferative diabetic retinopathy with macular edema, right eye: Secondary | ICD-10-CM | POA: Diagnosis not present

## 2022-05-28 DIAGNOSIS — E113312 Type 2 diabetes mellitus with moderate nonproliferative diabetic retinopathy with macular edema, left eye: Secondary | ICD-10-CM | POA: Diagnosis not present

## 2022-06-13 DIAGNOSIS — E113312 Type 2 diabetes mellitus with moderate nonproliferative diabetic retinopathy with macular edema, left eye: Secondary | ICD-10-CM | POA: Diagnosis not present

## 2022-06-13 DIAGNOSIS — H40003 Preglaucoma, unspecified, bilateral: Secondary | ICD-10-CM | POA: Diagnosis not present

## 2022-06-13 DIAGNOSIS — H3582 Retinal ischemia: Secondary | ICD-10-CM | POA: Diagnosis not present

## 2022-06-13 DIAGNOSIS — E113511 Type 2 diabetes mellitus with proliferative diabetic retinopathy with macular edema, right eye: Secondary | ICD-10-CM | POA: Diagnosis not present

## 2022-06-13 DIAGNOSIS — H31093 Other chorioretinal scars, bilateral: Secondary | ICD-10-CM | POA: Diagnosis not present

## 2022-06-13 DIAGNOSIS — H25813 Combined forms of age-related cataract, bilateral: Secondary | ICD-10-CM | POA: Diagnosis not present

## 2022-06-13 DIAGNOSIS — H35033 Hypertensive retinopathy, bilateral: Secondary | ICD-10-CM | POA: Diagnosis not present

## 2022-06-16 DIAGNOSIS — E1165 Type 2 diabetes mellitus with hyperglycemia: Secondary | ICD-10-CM | POA: Diagnosis not present

## 2022-06-16 DIAGNOSIS — I10 Essential (primary) hypertension: Secondary | ICD-10-CM | POA: Diagnosis not present

## 2022-06-16 DIAGNOSIS — N1831 Chronic kidney disease, stage 3a: Secondary | ICD-10-CM | POA: Diagnosis not present

## 2022-07-02 DIAGNOSIS — E113511 Type 2 diabetes mellitus with proliferative diabetic retinopathy with macular edema, right eye: Secondary | ICD-10-CM | POA: Diagnosis not present

## 2022-08-25 DIAGNOSIS — E785 Hyperlipidemia, unspecified: Secondary | ICD-10-CM | POA: Diagnosis not present

## 2022-08-25 DIAGNOSIS — E11319 Type 2 diabetes mellitus with unspecified diabetic retinopathy without macular edema: Secondary | ICD-10-CM | POA: Diagnosis not present

## 2022-08-25 DIAGNOSIS — I10 Essential (primary) hypertension: Secondary | ICD-10-CM | POA: Diagnosis not present

## 2022-08-25 DIAGNOSIS — N1831 Chronic kidney disease, stage 3a: Secondary | ICD-10-CM | POA: Diagnosis not present

## 2022-08-25 DIAGNOSIS — M79604 Pain in right leg: Secondary | ICD-10-CM | POA: Diagnosis not present

## 2022-09-02 DIAGNOSIS — H2511 Age-related nuclear cataract, right eye: Secondary | ICD-10-CM | POA: Diagnosis not present

## 2022-09-02 DIAGNOSIS — E113413 Type 2 diabetes mellitus with severe nonproliferative diabetic retinopathy with macular edema, bilateral: Secondary | ICD-10-CM | POA: Diagnosis not present

## 2022-09-02 DIAGNOSIS — H25011 Cortical age-related cataract, right eye: Secondary | ICD-10-CM | POA: Diagnosis not present

## 2022-09-02 DIAGNOSIS — H25041 Posterior subcapsular polar age-related cataract, right eye: Secondary | ICD-10-CM | POA: Diagnosis not present

## 2022-09-08 DIAGNOSIS — M25561 Pain in right knee: Secondary | ICD-10-CM | POA: Diagnosis not present

## 2022-09-08 DIAGNOSIS — M79604 Pain in right leg: Secondary | ICD-10-CM | POA: Diagnosis not present

## 2022-09-18 DIAGNOSIS — H2511 Age-related nuclear cataract, right eye: Secondary | ICD-10-CM | POA: Diagnosis not present

## 2022-09-18 DIAGNOSIS — H269 Unspecified cataract: Secondary | ICD-10-CM | POA: Diagnosis not present

## 2022-09-18 DIAGNOSIS — H25041 Posterior subcapsular polar age-related cataract, right eye: Secondary | ICD-10-CM | POA: Diagnosis not present

## 2022-09-18 DIAGNOSIS — H25811 Combined forms of age-related cataract, right eye: Secondary | ICD-10-CM | POA: Diagnosis not present

## 2022-09-18 DIAGNOSIS — H268 Other specified cataract: Secondary | ICD-10-CM | POA: Diagnosis not present

## 2022-09-18 DIAGNOSIS — H25011 Cortical age-related cataract, right eye: Secondary | ICD-10-CM | POA: Diagnosis not present

## 2022-10-06 DIAGNOSIS — G609 Hereditary and idiopathic neuropathy, unspecified: Secondary | ICD-10-CM | POA: Diagnosis not present

## 2022-10-06 DIAGNOSIS — G5731 Lesion of lateral popliteal nerve, right lower limb: Secondary | ICD-10-CM | POA: Diagnosis not present

## 2022-10-17 DIAGNOSIS — E113393 Type 2 diabetes mellitus with moderate nonproliferative diabetic retinopathy without macular edema, bilateral: Secondary | ICD-10-CM | POA: Diagnosis not present

## 2022-10-17 DIAGNOSIS — Z961 Presence of intraocular lens: Secondary | ICD-10-CM | POA: Diagnosis not present

## 2022-10-27 DIAGNOSIS — Z7984 Long term (current) use of oral hypoglycemic drugs: Secondary | ICD-10-CM | POA: Diagnosis not present

## 2022-10-27 DIAGNOSIS — Z91148 Patient's other noncompliance with medication regimen for other reason: Secondary | ICD-10-CM | POA: Diagnosis not present

## 2022-10-27 DIAGNOSIS — Z6835 Body mass index (BMI) 35.0-35.9, adult: Secondary | ICD-10-CM | POA: Diagnosis not present

## 2022-10-27 DIAGNOSIS — E11319 Type 2 diabetes mellitus with unspecified diabetic retinopathy without macular edema: Secondary | ICD-10-CM | POA: Diagnosis not present

## 2022-10-27 DIAGNOSIS — E669 Obesity, unspecified: Secondary | ICD-10-CM | POA: Diagnosis not present

## 2022-10-27 DIAGNOSIS — I10 Essential (primary) hypertension: Secondary | ICD-10-CM | POA: Diagnosis not present

## 2022-10-27 DIAGNOSIS — E785 Hyperlipidemia, unspecified: Secondary | ICD-10-CM | POA: Diagnosis not present

## 2022-11-10 DIAGNOSIS — M8589 Other specified disorders of bone density and structure, multiple sites: Secondary | ICD-10-CM | POA: Diagnosis not present

## 2023-01-06 DIAGNOSIS — K760 Fatty (change of) liver, not elsewhere classified: Secondary | ICD-10-CM | POA: Diagnosis not present

## 2023-01-06 DIAGNOSIS — Z6835 Body mass index (BMI) 35.0-35.9, adult: Secondary | ICD-10-CM | POA: Diagnosis not present

## 2023-01-06 DIAGNOSIS — I700 Atherosclerosis of aorta: Secondary | ICD-10-CM | POA: Diagnosis not present

## 2023-01-06 DIAGNOSIS — K625 Hemorrhage of anus and rectum: Secondary | ICD-10-CM | POA: Diagnosis not present

## 2023-01-06 DIAGNOSIS — Z888 Allergy status to other drugs, medicaments and biological substances status: Secondary | ICD-10-CM | POA: Diagnosis not present

## 2023-01-06 DIAGNOSIS — I10 Essential (primary) hypertension: Secondary | ICD-10-CM | POA: Diagnosis not present

## 2023-01-06 DIAGNOSIS — N179 Acute kidney failure, unspecified: Secondary | ICD-10-CM | POA: Diagnosis not present

## 2023-01-06 DIAGNOSIS — D509 Iron deficiency anemia, unspecified: Secondary | ICD-10-CM | POA: Diagnosis not present

## 2023-01-06 DIAGNOSIS — E119 Type 2 diabetes mellitus without complications: Secondary | ICD-10-CM | POA: Diagnosis not present

## 2023-01-06 DIAGNOSIS — D649 Anemia, unspecified: Secondary | ICD-10-CM | POA: Diagnosis not present

## 2023-01-06 DIAGNOSIS — E7849 Other hyperlipidemia: Secondary | ICD-10-CM | POA: Diagnosis not present

## 2023-01-06 DIAGNOSIS — R109 Unspecified abdominal pain: Secondary | ICD-10-CM | POA: Diagnosis not present

## 2023-01-06 DIAGNOSIS — Z880 Allergy status to penicillin: Secondary | ICD-10-CM | POA: Diagnosis not present

## 2023-01-06 DIAGNOSIS — K5730 Diverticulosis of large intestine without perforation or abscess without bleeding: Secondary | ICD-10-CM | POA: Diagnosis not present

## 2023-01-06 DIAGNOSIS — K274 Chronic or unspecified peptic ulcer, site unspecified, with hemorrhage: Secondary | ICD-10-CM | POA: Diagnosis not present

## 2023-01-06 DIAGNOSIS — E669 Obesity, unspecified: Secondary | ICD-10-CM | POA: Diagnosis not present

## 2023-01-06 DIAGNOSIS — K922 Gastrointestinal hemorrhage, unspecified: Secondary | ICD-10-CM | POA: Diagnosis not present

## 2023-01-06 DIAGNOSIS — K921 Melena: Secondary | ICD-10-CM | POA: Diagnosis not present

## 2023-01-06 DIAGNOSIS — I7090 Unspecified atherosclerosis: Secondary | ICD-10-CM | POA: Diagnosis not present

## 2023-01-06 DIAGNOSIS — R195 Other fecal abnormalities: Secondary | ICD-10-CM | POA: Diagnosis not present

## 2023-01-06 DIAGNOSIS — K5791 Diverticulosis of intestine, part unspecified, without perforation or abscess with bleeding: Secondary | ICD-10-CM | POA: Diagnosis not present

## 2023-02-02 DIAGNOSIS — E1165 Type 2 diabetes mellitus with hyperglycemia: Secondary | ICD-10-CM | POA: Diagnosis not present

## 2023-02-02 DIAGNOSIS — E1122 Type 2 diabetes mellitus with diabetic chronic kidney disease: Secondary | ICD-10-CM | POA: Diagnosis not present

## 2023-02-02 DIAGNOSIS — N1831 Chronic kidney disease, stage 3a: Secondary | ICD-10-CM | POA: Diagnosis not present

## 2023-02-02 DIAGNOSIS — Z8719 Personal history of other diseases of the digestive system: Secondary | ICD-10-CM | POA: Diagnosis not present

## 2023-02-02 DIAGNOSIS — Z862 Personal history of diseases of the blood and blood-forming organs and certain disorders involving the immune mechanism: Secondary | ICD-10-CM | POA: Diagnosis not present

## 2023-02-02 DIAGNOSIS — Z09 Encounter for follow-up examination after completed treatment for conditions other than malignant neoplasm: Secondary | ICD-10-CM | POA: Diagnosis not present

## 2023-02-02 DIAGNOSIS — E785 Hyperlipidemia, unspecified: Secondary | ICD-10-CM | POA: Diagnosis not present

## 2023-02-09 DIAGNOSIS — D509 Iron deficiency anemia, unspecified: Secondary | ICD-10-CM | POA: Diagnosis not present

## 2023-02-09 DIAGNOSIS — K625 Hemorrhage of anus and rectum: Secondary | ICD-10-CM | POA: Diagnosis not present

## 2023-04-13 DIAGNOSIS — Z1231 Encounter for screening mammogram for malignant neoplasm of breast: Secondary | ICD-10-CM | POA: Diagnosis not present

## 2023-05-01 DIAGNOSIS — H25813 Combined forms of age-related cataract, bilateral: Secondary | ICD-10-CM | POA: Diagnosis not present

## 2023-05-01 DIAGNOSIS — H4312 Vitreous hemorrhage, left eye: Secondary | ICD-10-CM | POA: Diagnosis not present

## 2023-05-01 DIAGNOSIS — H3581 Retinal edema: Secondary | ICD-10-CM | POA: Diagnosis not present

## 2023-05-01 DIAGNOSIS — H31093 Other chorioretinal scars, bilateral: Secondary | ICD-10-CM | POA: Diagnosis not present

## 2023-05-01 DIAGNOSIS — H3582 Retinal ischemia: Secondary | ICD-10-CM | POA: Diagnosis not present

## 2023-05-01 DIAGNOSIS — E113522 Type 2 diabetes mellitus with proliferative diabetic retinopathy with traction retinal detachment involving the macula, left eye: Secondary | ICD-10-CM | POA: Diagnosis not present

## 2023-05-01 DIAGNOSIS — E113511 Type 2 diabetes mellitus with proliferative diabetic retinopathy with macular edema, right eye: Secondary | ICD-10-CM | POA: Diagnosis not present

## 2023-05-19 DIAGNOSIS — H3342 Traction detachment of retina, left eye: Secondary | ICD-10-CM | POA: Diagnosis not present

## 2023-05-19 DIAGNOSIS — E113522 Type 2 diabetes mellitus with proliferative diabetic retinopathy with traction retinal detachment involving the macula, left eye: Secondary | ICD-10-CM | POA: Diagnosis not present

## 2023-05-25 DIAGNOSIS — N1832 Chronic kidney disease, stage 3b: Secondary | ICD-10-CM | POA: Diagnosis not present

## 2023-05-25 DIAGNOSIS — E1165 Type 2 diabetes mellitus with hyperglycemia: Secondary | ICD-10-CM | POA: Diagnosis not present

## 2023-05-25 DIAGNOSIS — E1122 Type 2 diabetes mellitus with diabetic chronic kidney disease: Secondary | ICD-10-CM | POA: Diagnosis not present

## 2023-05-25 DIAGNOSIS — Z0000 Encounter for general adult medical examination without abnormal findings: Secondary | ICD-10-CM | POA: Diagnosis not present

## 2023-05-25 DIAGNOSIS — I10 Essential (primary) hypertension: Secondary | ICD-10-CM | POA: Diagnosis not present

## 2023-05-25 DIAGNOSIS — D8481 Immunodeficiency due to conditions classified elsewhere: Secondary | ICD-10-CM | POA: Diagnosis not present

## 2023-05-27 DIAGNOSIS — E113513 Type 2 diabetes mellitus with proliferative diabetic retinopathy with macular edema, bilateral: Secondary | ICD-10-CM | POA: Diagnosis not present

## 2023-05-27 DIAGNOSIS — H40003 Preglaucoma, unspecified, bilateral: Secondary | ICD-10-CM | POA: Diagnosis not present

## 2023-05-27 DIAGNOSIS — H3582 Retinal ischemia: Secondary | ICD-10-CM | POA: Diagnosis not present

## 2023-05-27 DIAGNOSIS — H35033 Hypertensive retinopathy, bilateral: Secondary | ICD-10-CM | POA: Diagnosis not present

## 2023-05-27 DIAGNOSIS — H31093 Other chorioretinal scars, bilateral: Secondary | ICD-10-CM | POA: Diagnosis not present

## 2023-05-27 DIAGNOSIS — H3581 Retinal edema: Secondary | ICD-10-CM | POA: Diagnosis not present

## 2023-05-27 DIAGNOSIS — Z961 Presence of intraocular lens: Secondary | ICD-10-CM | POA: Diagnosis not present

## 2023-06-10 DIAGNOSIS — E113511 Type 2 diabetes mellitus with proliferative diabetic retinopathy with macular edema, right eye: Secondary | ICD-10-CM | POA: Diagnosis not present

## 2023-06-17 DIAGNOSIS — E113512 Type 2 diabetes mellitus with proliferative diabetic retinopathy with macular edema, left eye: Secondary | ICD-10-CM | POA: Diagnosis not present

## 2023-06-24 DIAGNOSIS — M7989 Other specified soft tissue disorders: Secondary | ICD-10-CM | POA: Diagnosis not present

## 2023-06-24 DIAGNOSIS — I10 Essential (primary) hypertension: Secondary | ICD-10-CM | POA: Diagnosis not present

## 2023-06-24 DIAGNOSIS — E1122 Type 2 diabetes mellitus with diabetic chronic kidney disease: Secondary | ICD-10-CM | POA: Diagnosis not present

## 2023-06-24 DIAGNOSIS — E1165 Type 2 diabetes mellitus with hyperglycemia: Secondary | ICD-10-CM | POA: Diagnosis not present

## 2023-06-24 DIAGNOSIS — Z6837 Body mass index (BMI) 37.0-37.9, adult: Secondary | ICD-10-CM | POA: Diagnosis not present

## 2023-06-24 DIAGNOSIS — N1832 Chronic kidney disease, stage 3b: Secondary | ICD-10-CM | POA: Diagnosis not present

## 2023-07-08 DIAGNOSIS — E113511 Type 2 diabetes mellitus with proliferative diabetic retinopathy with macular edema, right eye: Secondary | ICD-10-CM | POA: Diagnosis not present

## 2023-07-20 DIAGNOSIS — E113512 Type 2 diabetes mellitus with proliferative diabetic retinopathy with macular edema, left eye: Secondary | ICD-10-CM | POA: Diagnosis not present

## 2023-07-22 DIAGNOSIS — E1165 Type 2 diabetes mellitus with hyperglycemia: Secondary | ICD-10-CM | POA: Diagnosis not present

## 2023-07-22 DIAGNOSIS — N1831 Chronic kidney disease, stage 3a: Secondary | ICD-10-CM | POA: Diagnosis not present

## 2023-07-22 DIAGNOSIS — I10 Essential (primary) hypertension: Secondary | ICD-10-CM | POA: Diagnosis not present

## 2023-07-22 DIAGNOSIS — E1122 Type 2 diabetes mellitus with diabetic chronic kidney disease: Secondary | ICD-10-CM | POA: Diagnosis not present

## 2023-07-22 DIAGNOSIS — L98491 Non-pressure chronic ulcer of skin of other sites limited to breakdown of skin: Secondary | ICD-10-CM | POA: Diagnosis not present

## 2023-07-22 DIAGNOSIS — E11319 Type 2 diabetes mellitus with unspecified diabetic retinopathy without macular edema: Secondary | ICD-10-CM | POA: Diagnosis not present

## 2023-07-23 ENCOUNTER — Other Ambulatory Visit (HOSPITAL_COMMUNITY): Payer: Self-pay | Admitting: Family Medicine

## 2023-07-23 DIAGNOSIS — M7989 Other specified soft tissue disorders: Secondary | ICD-10-CM

## 2023-08-05 DIAGNOSIS — E113511 Type 2 diabetes mellitus with proliferative diabetic retinopathy with macular edema, right eye: Secondary | ICD-10-CM | POA: Diagnosis not present

## 2023-08-27 DIAGNOSIS — E113512 Type 2 diabetes mellitus with proliferative diabetic retinopathy with macular edema, left eye: Secondary | ICD-10-CM | POA: Diagnosis not present

## 2023-08-31 DIAGNOSIS — E1165 Type 2 diabetes mellitus with hyperglycemia: Secondary | ICD-10-CM | POA: Diagnosis not present

## 2023-08-31 DIAGNOSIS — E11319 Type 2 diabetes mellitus with unspecified diabetic retinopathy without macular edema: Secondary | ICD-10-CM | POA: Diagnosis not present

## 2023-08-31 DIAGNOSIS — I872 Venous insufficiency (chronic) (peripheral): Secondary | ICD-10-CM | POA: Diagnosis not present

## 2023-08-31 DIAGNOSIS — Z6834 Body mass index (BMI) 34.0-34.9, adult: Secondary | ICD-10-CM | POA: Diagnosis not present

## 2023-08-31 DIAGNOSIS — E785 Hyperlipidemia, unspecified: Secondary | ICD-10-CM | POA: Diagnosis not present

## 2023-08-31 DIAGNOSIS — N1832 Chronic kidney disease, stage 3b: Secondary | ICD-10-CM | POA: Diagnosis not present

## 2023-08-31 DIAGNOSIS — I10 Essential (primary) hypertension: Secondary | ICD-10-CM | POA: Diagnosis not present

## 2023-08-31 DIAGNOSIS — L0100 Impetigo, unspecified: Secondary | ICD-10-CM | POA: Diagnosis not present

## 2023-09-01 ENCOUNTER — Ambulatory Visit (HOSPITAL_COMMUNITY): Payer: Medicare PPO | Attending: Family Medicine

## 2023-09-01 DIAGNOSIS — E119 Type 2 diabetes mellitus without complications: Secondary | ICD-10-CM | POA: Insufficient documentation

## 2023-09-01 DIAGNOSIS — I10 Essential (primary) hypertension: Secondary | ICD-10-CM | POA: Insufficient documentation

## 2023-09-01 DIAGNOSIS — I3139 Other pericardial effusion (noninflammatory): Secondary | ICD-10-CM | POA: Diagnosis not present

## 2023-09-01 DIAGNOSIS — M7989 Other specified soft tissue disorders: Secondary | ICD-10-CM | POA: Insufficient documentation

## 2023-09-01 DIAGNOSIS — G4730 Sleep apnea, unspecified: Secondary | ICD-10-CM | POA: Diagnosis not present

## 2023-09-01 LAB — ECHOCARDIOGRAM COMPLETE: S' Lateral: 2.98 cm

## 2023-09-11 DIAGNOSIS — L0390 Cellulitis, unspecified: Secondary | ICD-10-CM | POA: Diagnosis not present

## 2023-09-11 DIAGNOSIS — E1165 Type 2 diabetes mellitus with hyperglycemia: Secondary | ICD-10-CM | POA: Diagnosis not present

## 2023-09-11 DIAGNOSIS — Z9181 History of falling: Secondary | ICD-10-CM | POA: Diagnosis not present

## 2023-09-11 NOTE — Therapy (Unsigned)
OUTPATIENT PHYSICAL THERAPY wound EVALUATION   Patient Name: Alexandra Jensen MRN: [id number] DOB:1951-08-09, 73 y.o., female Today's Date: 09/14/2023   PCP: Koren Shiver, DO REFERRING PROVIDER: Koren Shiver, DO  END OF SESSION:  PT End of Session - 09/14/23 0908     Visit Number 1    Number of Visits 4    Date for PT Re-Evaluation 10/12/23    Authorization Type humana    Progress Note Due on Visit 4    PT Start Time 0815   pt late   PT Stop Time 0850    PT Time Calculation (min) 35 min    Activity Tolerance Patient tolerated treatment well    Behavior During Therapy WFL for tasks assessed/performed             Past Medical History:  Diagnosis Date   Allergic rhinitis, cause unspecified    Cardiomegaly    Nontoxic multinodular goiter    Obesity, unspecified    Obstructive sleep apnea (adult) (pediatric)    Other and unspecified hyperlipidemia    Type II or unspecified type diabetes mellitus with ophthalmic manifestations, not stated as uncontrolled(250.50)    Type II or unspecified type diabetes mellitus without mention of complication, not stated as uncontrolled    Unspecified essential hypertension    Unspecified glaucoma(365.9)    Past Surgical History:  Procedure Laterality Date   fibroid cyst left breast     TONSILLECTOMY     VAGINAL HYSTERECTOMY     Patient Active Problem List   Diagnosis Date Noted   Allergic rhinitis 10/17/2010   COUGH, CHRONIC 10/17/2010   DIABETES, TYPE 2 10/16/2010   DIABETIC  RETINOPATHY 10/16/2010   HYPERLIPIDEMIA 10/16/2010   OBESITY 10/16/2010   OBSTRUCTIVE SLEEP APNEA 10/16/2010   Unspecified glaucoma 10/16/2010   Essential hypertension 10/16/2010   CARDIOMEGALY, MILD 10/16/2010   GOITER, MULTINODULAR 04/18/2004    ONSET DATE: approximate:  11/12  REFERRING DIAG:  Diagnosis  L03.90 (ICD-10-CM) - Wound cellulitis    THERAPY DIAG:  Pain in left lower leg  Leg wound, left, subsequent  encounter  Rationale for Evaluation and Treatment: Rehabilitation     Wound Therapy - 09/14/23 0001     Subjective Pt states that she had an irritation on her leg that had been getting bigger for the past several months.  On Jan 13th she fell on the ice and the ice tore her skin off of the leg.  She has noted increased swelling, increased heat and pain.  She has been given and antibiotic cream by her MD but did not bring it to this visit.    Patient and Family Stated Goals no pain, wound to be healed and decreased swelling in her leg.    Date of Onset 06/30/23    Prior Treatments self care with prescription antibiotic creams.    Pain Scale 0-10    Pain Score 6     Pain Type Acute pain    Pain Location Leg    Pain Orientation Anterior;Left    Pain Descriptors / Indicators Burning;Discomfort    Pain Onset Unable to tell    Patients Stated Pain Goal 0    Pain Intervention(s) Emotional support    Multiple Pain Sites Yes   has chronic knee pain but we will not be addressing this.   Evaluation and Treatment Procedures Explained to Patient/Family Yes    Evaluation and Treatment Procedures agreed to    Wound Properties Date First Assessed: 09/14/23 Wound  Type: Other (Comment) Location: Pretibial Location Orientation: Left;Proximal Present on Admission: Yes   Wound Image Images linked: 1    Dressing Type Non adherent    Dressing Changed Changed    Dressing Status Old drainage    Dressing Change Frequency PRN    Site / Wound Assessment Painful;Pink;Yellow    % Wound base Red or Granulating 60%    % Wound base Yellow/Fibrinous Exudate 40%    Peri-wound Assessment Edema    Wound Length (cm) 12.5 cm    Wound Width (cm) 4.5 cm    Wound Surface Area (cm^2) 56.25 cm^2    Drainage Amount Scant    Drainage Description Serous    Treatment Cleansed;Debridement (Selective)    Selective Debridement (non-excisional) - Location wound bed as tolerated    Selective Debridement (non-excisional) - Tools  Used Forceps    Selective Debridement (non-excisional) - Tissue Removed slough, biofilm    Wound Therapy - Clinical Statement as below    Wound Therapy - Functional Problem List difficulty in bathing and dressing    Factors Delaying/Impairing Wound Healing Diabetes Mellitus    Wound Therapy - Frequency 2X / week    Wound Therapy - Current Recommendations PT    Wound Plan debridement and dreassing change as needed    Dressing  xeroform, 4x4, kling and coban followed by netting               PATIENT EDUCATION: Education details: keep dressing dry take off if increased pain  Person educated: Patient Education method: Explanation Education comprehension: verbalized understanding   HOME EXERCISE PROGRAM: Ankle pumps   GOALS: Goals reviewed with patient? No  SHORT TERM GOALS: Target date: 09/21/23  Pt wound to be 100% granulation  Baseline: Goal status: INITIAL  2.  Pt pain to be no greater than a 3/10 Baseline:  Goal status: INITIAL   LONG TERM GOALS: Target date: 10/05/23  Wound to be healed  Baseline:  Goal status: INITIAL  ASSESSMENT:  CLINICAL IMPRESSION: Patient is a 73 y.o. female who was seen today for physical therapy evaluation and treatment for lower extremity cellulitis.  Evaluation demonstrates increased swelling, increased pain, decreased skin integrity and difficulty bathing and dressing.   Alexandra Jensen will benefit from skilled PT to address these issues and resolve the wound and cellulitis.    OBJECTIVE IMPAIRMENTS: increased edema, pain, and decreased skin integrity.  .   ACTIVITY LIMITATIONS: bathing, dressing, hygiene/grooming, and locomotion level  PARTICIPATION LIMITATIONS: community activity  PERSONAL FACTORS: 1 comorbidity: DM  are also affecting patient's functional outcome.   REHAB POTENTIAL: Good  CLINICAL DECISION MAKING: Stable/uncomplicated  EVALUATION COMPLEXITY: Low  PLAN: PT FREQUENCY: 1-2x/week  PT DURATION: 4  weeks  PLANNED INTERVENTIONS: 97535- Self Care, 16109- Manual therapy, and 97597- Wound care (first 20 sq cm)  PLAN FOR NEXT SESSION: continue debridement dressing to be assessed and changed as needed to create a healing environment.   Virgina Organ, PT CLT [phone]  09/14/2023, 9:18 AM   [employer] Auth Request  Referring diagnosis code (ICD 10)? L03.90 Treatment diagnosis codes (ICD 10)? (if different than referring diagnosis) [id number]; S81.802D What was this (referring dx) caused by? []  Surgery [x]  Fall []  Ongoing issue []  Arthritis [x]  Other: _Skin having issues prior to the fall___________  Laterality: []  Rt [x]  Lt []  Both  Deficits: [x]  Pain []  Stiffness []  Weakness [x]  Edema []  Balance Deficits []  Coordination []  Gait Disturbance []  ROM [x]  Other decreased skin integrity   Functional Tool Score:  Having pain with ambulation but able to ambulate.   CPT codes: See Planned Interventions listed in the Plan section of the Evaluation.

## 2023-09-14 ENCOUNTER — Ambulatory Visit (HOSPITAL_COMMUNITY): Payer: Medicare PPO | Attending: Family Medicine | Admitting: Physical Therapy

## 2023-09-14 ENCOUNTER — Encounter (HOSPITAL_COMMUNITY): Payer: Self-pay | Admitting: Physical Therapy

## 2023-09-14 ENCOUNTER — Other Ambulatory Visit: Payer: Self-pay

## 2023-09-14 DIAGNOSIS — M79662 Pain in left lower leg: Secondary | ICD-10-CM | POA: Insufficient documentation

## 2023-09-14 DIAGNOSIS — S81802D Unspecified open wound, left lower leg, subsequent encounter: Secondary | ICD-10-CM | POA: Insufficient documentation

## 2023-09-16 ENCOUNTER — Ambulatory Visit (HOSPITAL_COMMUNITY): Payer: Medicare PPO | Admitting: Physical Therapy

## 2023-09-16 DIAGNOSIS — M79662 Pain in left lower leg: Secondary | ICD-10-CM

## 2023-09-16 DIAGNOSIS — S81802D Unspecified open wound, left lower leg, subsequent encounter: Secondary | ICD-10-CM | POA: Diagnosis not present

## 2023-09-16 NOTE — Therapy (Signed)
OUTPATIENT PHYSICAL THERAPY wound Treatment    Patient Name: Alexandra Jensen MRN: [id number] DOB:1950/09/08, 73 y.o., female Today's Date: 09/16/2023   PCP: Koren Shiver, DO REFERRING PROVIDER: Koren Shiver, DO  END OF SESSION:  PT End of Session - 09/16/23 1553     Visit Number 2    Number of Visits 4    Date for PT Re-Evaluation 10/12/23    Authorization Type humana    Authorization Time Period 1/29-2/24    Authorization - Number of Visits 1    Progress Note Due on Visit 4    PT Start Time 0315    PT Stop Time 0345    PT Time Calculation (min) 30 min    Activity Tolerance Patient tolerated treatment well    Behavior During Therapy WFL for tasks assessed/performed             Past Medical History:  Diagnosis Date   Allergic rhinitis, cause unspecified    Cardiomegaly    Nontoxic multinodular goiter    Obesity, unspecified    Obstructive sleep apnea (adult) (pediatric)    Other and unspecified hyperlipidemia    Type II or unspecified type diabetes mellitus with ophthalmic manifestations, not stated as uncontrolled(250.50)    Type II or unspecified type diabetes mellitus without mention of complication, not stated as uncontrolled    Unspecified essential hypertension    Unspecified glaucoma(365.9)    Past Surgical History:  Procedure Laterality Date   fibroid cyst left breast     TONSILLECTOMY     VAGINAL HYSTERECTOMY     Patient Active Problem List   Diagnosis Date Noted   Allergic rhinitis 10/17/2010   COUGH, CHRONIC 10/17/2010   DIABETES, TYPE 2 10/16/2010   DIABETIC  RETINOPATHY 10/16/2010   HYPERLIPIDEMIA 10/16/2010   OBESITY 10/16/2010   OBSTRUCTIVE SLEEP APNEA 10/16/2010   Unspecified glaucoma 10/16/2010   Essential hypertension 10/16/2010   CARDIOMEGALY, MILD 10/16/2010   GOITER, MULTINODULAR 04/18/2004    ONSET DATE: approximate:  11/12  REFERRING DIAG:  Diagnosis  L03.90 (ICD-10-CM) - Wound cellulitis    THERAPY DIAG:   Pain in left lower leg  Leg wound, left, subsequent encounter  Rationale for Evaluation and Treatment: Rehabilitation     Wound Therapy - 09/16/23 0001     Subjective Pt states that the dressing was a little tight but she was up on it quite a bit yesterday    Patient and Family Stated Goals no pain, wound to be healed and decreased swelling in her leg.    Date of Onset 06/30/23    Prior Treatments self care with prescription antibiotic creams.    Pain Scale 0-10    Pain Score 5     Pain Type Acute pain    Pain Location Leg    Pain Orientation Left;Anterior    Pain Descriptors / Indicators Burning    Pain Onset Other (Comment)   especially with palpation   Patients Stated Pain Goal 0    Multiple Pain Sites Yes    Evaluation and Treatment Procedures Explained to Patient/Family Yes    Evaluation and Treatment Procedures agreed to    Wound Properties Date First Assessed: 09/14/23 Wound Type: Other (Comment) Location: Pretibial Location Orientation: Left;Proximal Present on Admission: Yes   Dressing Type Compression wrap;Impregnated gauze (bismuth)    Dressing Changed Changed    Dressing Status Old drainage    Dressing Change Frequency PRN    Site / Wound Assessment Painful;Pink;Yellow    %  Wound base Red or Granulating 90%    % Wound base Yellow/Fibrinous Exudate 10%    Peri-wound Assessment Edema    Wound Length (cm) --   majority of wound healed 4 small seperate areas at inferior end of wound.   Drainage Amount Scant    Drainage Description Serous    Treatment Cleansed;Debridement (Selective)    Selective Debridement (non-excisional) - Location wound bed as tolerated    Selective Debridement (non-excisional) - Tools Used Forceps    Selective Debridement (non-excisional) - Tissue Removed slough, biofilm    Wound Therapy - Clinical Statement as below    Wound Therapy - Functional Problem List difficulty in bathing and dressing    Factors Delaying/Impairing Wound Healing  Diabetes Mellitus    Wound Therapy - Frequency 2X / week    Wound Therapy - Current Recommendations PT    Wound Plan debridement and dreassing change as needed    Dressing  xeroform, 4x4, kerlix followed by netting               PATIENT EDUCATION: Education details: keep dressing dry take off if increased pain  Person educated: Patient Education method: Explanation Education comprehension: verbalized understanding   HOME EXERCISE PROGRAM: Ankle pumps   GOALS: Goals reviewed with patient? No  SHORT TERM GOALS: Target date: 09/21/23  Pt wound to be 100% granulation  Baseline: Goal status: IN PROGRESS  2.  Pt pain to be no greater than a 3/10 Baseline:  Goal status: IN PROGRESS   LONG TERM GOALS: Target date: 10/05/23  Wound to be healed  Baseline:  Goal status: IN PROGRESS  ASSESSMENT:  CLINICAL IMPRESSION: Pt wound has approximated well.  Wound is 100% granulated except for 4 small areas along the inferior aspect of the wound.   Ms. Maricle will continue  benefit from skilled PT to address these issues and resolve the wound and cellulitis.    OBJECTIVE IMPAIRMENTS: increased edema, pain, and decreased skin integrity.  .   ACTIVITY LIMITATIONS: bathing, dressing, hygiene/grooming, and locomotion level  PARTICIPATION LIMITATIONS: community activity  PERSONAL FACTORS: 1 comorbidity: DM  are also affecting patient's functional outcome.   REHAB POTENTIAL: Good  CLINICAL DECISION MAKING: Stable/uncomplicated  EVALUATION COMPLEXITY: Low  PLAN: PT FREQUENCY: 1-2x/week  PT DURATION: 4 weeks  PLANNED INTERVENTIONS: 97535- Self Care, 16109- Manual therapy, and 97597- Wound care (first 20 sq cm)  PLAN FOR NEXT SESSION: continue debridement dressing to be assessed and changed as needed to create a healing environment.   Virgina Organ, PT CLT [phone]  09/16/2023, 3:56 PM

## 2023-09-17 ENCOUNTER — Ambulatory Visit (HOSPITAL_COMMUNITY): Payer: Medicare PPO | Admitting: Physical Therapy

## 2023-09-21 DIAGNOSIS — L0390 Cellulitis, unspecified: Secondary | ICD-10-CM | POA: Diagnosis not present

## 2023-09-21 DIAGNOSIS — L0100 Impetigo, unspecified: Secondary | ICD-10-CM | POA: Diagnosis not present

## 2023-09-21 DIAGNOSIS — Z09 Encounter for follow-up examination after completed treatment for conditions other than malignant neoplasm: Secondary | ICD-10-CM | POA: Diagnosis not present

## 2023-09-22 ENCOUNTER — Ambulatory Visit (HOSPITAL_COMMUNITY): Payer: Medicare PPO | Attending: Family Medicine | Admitting: Physical Therapy

## 2023-09-22 DIAGNOSIS — M79662 Pain in left lower leg: Secondary | ICD-10-CM | POA: Diagnosis not present

## 2023-09-22 DIAGNOSIS — S81802D Unspecified open wound, left lower leg, subsequent encounter: Secondary | ICD-10-CM

## 2023-09-22 NOTE — Therapy (Addendum)
 OUTPATIENT PHYSICAL THERAPY wound Treatment    Patient Name: Alexandra  TATAYANA Jensen MRN: [id number] DOB:[birth year], 73 y.o., female Today's Date: 09/22/2023  PHYSICAL THERAPY DISCHARGE SUMMARY  Visits from Start of Care: 3  Current functional level related to goals / functional outcomes: Wound is healed   Remaining deficits: none   Education / Equipment: Self care   Patient agrees to discharge. Patient goals were met. Patient is being discharged due to meeting the stated rehab goals.  PCP: Cindy Clotilda CHRISTELLA, DO REFERRING PROVIDER: Cindy Clotilda CHRISTELLA, DO  END OF SESSION:   PT End of Session - 09/22/23 1110     Visit Number 3    Number of Visits 3   Date for PT Re-Evaluation 10/12/23    Authorization Type humana    Authorization Time Period 1/29-2/24    Authorization - Number of Visits 2    Progress Note Due on Visit 4    PT Start Time 0945    PT Stop Time 1000    PT Time Calculation (min) 15 min    Activity Tolerance Patient tolerated treatment well    Behavior During Therapy WFL for tasks assessed/performed                Past Medical History:  Diagnosis Date   Allergic rhinitis, cause unspecified    Cardiomegaly    Nontoxic multinodular goiter    Obesity, unspecified    Obstructive sleep apnea (adult) (pediatric)    Other and unspecified hyperlipidemia    Type II or unspecified type diabetes mellitus with ophthalmic manifestations, not stated as uncontrolled(250.50)    Type II or unspecified type diabetes mellitus without mention of complication, not stated as uncontrolled    Unspecified essential hypertension    Unspecified glaucoma(365.9)    Past Surgical History:  Procedure Laterality Date   fibroid cyst left breast     TONSILLECTOMY     VAGINAL HYSTERECTOMY     Patient Active Problem List   Diagnosis Date Noted   Allergic rhinitis 10/17/2010   COUGH, CHRONIC 10/17/2010   DIABETES, TYPE 2 10/16/2010   DIABETIC  RETINOPATHY 10/16/2010    HYPERLIPIDEMIA 10/16/2010   OBESITY 10/16/2010   OBSTRUCTIVE SLEEP APNEA 10/16/2010   Unspecified glaucoma 10/16/2010   Essential hypertension 10/16/2010   CARDIOMEGALY, MILD 10/16/2010   GOITER, MULTINODULAR 04/18/2004    ONSET DATE: approximate:  11/12  REFERRING DIAG:  Diagnosis  L03.90 (ICD-10-CM) - Wound cellulitis    THERAPY DIAG:  Pain in left lower leg  Leg wound, left, subsequent encounter  Rationale for Evaluation and Treatment: Rehabilitation     Wound Therapy - 09/22/23 1111     Subjective pt states she removed her dressing Saturday and cleansed it.  Did not put new dressing on it    Patient and Family Stated Goals no pain, wound to be healed and decreased swelling in her leg.    Date of Onset 06/30/23    Prior Treatments self care with prescription antibiotic creams.    Pain Scale 0-10    Pain Score 0-No pain    Evaluation and Treatment Procedures Explained to Patient/Family Yes    Evaluation and Treatment Procedures agreed to    Wound Properties Date First Assessed: 09/14/23 Wound Type: Other (Comment) Location: Pretibial Location Orientation: Left;Proximal Present on Admission: Yes   Wound Image Images linked: 1    Dressing Type None    Dressing Changed Other (Comment)    Dressing Status None    Dressing Change Frequency PRN  Site / Wound Assessment Pink    % Wound base Red or Granulating 100%    Peri-wound Assessment Edema    Drainage Amount None    Treatment Cleansed    Selective Debridement (non-excisional) - Location wound bed as tolerated    Selective Debridement (non-excisional) - Tools Used Forceps    Selective Debridement (non-excisional) - Tissue Removed dry skin    Wound Therapy - Clinical Statement as below    Wound Therapy - Functional Problem List difficulty in bathing and dressing    Factors Delaying/Impairing Wound Healing Diabetes Mellitus    Wound Therapy - Frequency 2X / week    Wound Therapy - Current Recommendations PT    Wound  Plan discharge as wound healed, no skilled treatment needed    Dressing  none              PATIENT EDUCATION: Education details: keep dressing dry take off if increased pain  Person educated: Patient Education method: Explanation Education comprehension: verbalized understanding   HOME EXERCISE PROGRAM: Ankle pumps   GOALS: Goals reviewed with patient? Yes  SHORT TERM GOALS: Target date: 09/21/23  Pt wound to be 100% granulation  Baseline: Goal status: MET  2.  Pt pain to be no greater than a 3/10 Baseline:  Goal status: MET   LONG TERM GOALS: Target date: 10/05/23  Wound to be healed  Baseline:  Goal status: MET  ASSESSMENT:  CLINICAL IMPRESSION: Wound is now healed, only dry skin remains. Therapist able to remove 75% of dry skin today.  Educated to moisturize area well, no need to provide bandage as no open areas.  All goals met.  OBJECTIVE IMPAIRMENTS: increased edema, pain, and decreased skin integrity.  .   ACTIVITY LIMITATIONS: bathing, dressing, hygiene/grooming, and locomotion level  PARTICIPATION LIMITATIONS: community activity  PERSONAL FACTORS: 1 comorbidity: DM  are also affecting patient's functional outcome.   REHAB POTENTIAL: Good  CLINICAL DECISION MAKING: Stable/uncomplicated  EVALUATION COMPLEXITY: Low  PLAN: PT FREQUENCY: 1-2x/week  PT DURATION: 4 weeks  PLANNED INTERVENTIONS: 97535- Self Care, 02859- Manual therapy, and 97597- Wound care (first 20 sq cm)  PLAN FOR NEXT SESSION: discharge as wound is healed.    Greig KATHEE Fuse, PTA/CLT [facility] Ph: [phone] Montie Metro, PT CLT [phone]  09/22/2023, 11:18 AM

## 2023-09-25 ENCOUNTER — Ambulatory Visit (HOSPITAL_COMMUNITY): Payer: Medicare PPO

## 2023-09-28 DIAGNOSIS — E1122 Type 2 diabetes mellitus with diabetic chronic kidney disease: Secondary | ICD-10-CM | POA: Diagnosis not present

## 2023-09-28 DIAGNOSIS — Z791 Long term (current) use of non-steroidal anti-inflammatories (NSAID): Secondary | ICD-10-CM | POA: Diagnosis not present

## 2023-09-28 DIAGNOSIS — G459 Transient cerebral ischemic attack, unspecified: Secondary | ICD-10-CM | POA: Diagnosis not present

## 2023-09-28 DIAGNOSIS — E1165 Type 2 diabetes mellitus with hyperglycemia: Secondary | ICD-10-CM | POA: Diagnosis not present

## 2023-09-28 DIAGNOSIS — I6782 Cerebral ischemia: Secondary | ICD-10-CM | POA: Diagnosis not present

## 2023-09-28 DIAGNOSIS — Z7984 Long term (current) use of oral hypoglycemic drugs: Secondary | ICD-10-CM | POA: Diagnosis not present

## 2023-09-28 DIAGNOSIS — I129 Hypertensive chronic kidney disease with stage 1 through stage 4 chronic kidney disease, or unspecified chronic kidney disease: Secondary | ICD-10-CM | POA: Diagnosis not present

## 2023-09-28 DIAGNOSIS — D649 Anemia, unspecified: Secondary | ICD-10-CM | POA: Diagnosis not present

## 2023-09-28 DIAGNOSIS — N179 Acute kidney failure, unspecified: Secondary | ICD-10-CM | POA: Diagnosis not present

## 2023-09-28 DIAGNOSIS — E860 Dehydration: Secondary | ICD-10-CM | POA: Diagnosis not present

## 2023-09-28 DIAGNOSIS — D631 Anemia in chronic kidney disease: Secondary | ICD-10-CM | POA: Diagnosis not present

## 2023-09-28 DIAGNOSIS — N184 Chronic kidney disease, stage 4 (severe): Secondary | ICD-10-CM | POA: Diagnosis not present

## 2023-09-28 DIAGNOSIS — R531 Weakness: Secondary | ICD-10-CM | POA: Diagnosis not present

## 2023-09-29 ENCOUNTER — Ambulatory Visit (HOSPITAL_COMMUNITY): Payer: Medicare PPO | Admitting: Physical Therapy

## 2023-09-29 DIAGNOSIS — D649 Anemia, unspecified: Secondary | ICD-10-CM | POA: Diagnosis not present

## 2023-09-30 DIAGNOSIS — D649 Anemia, unspecified: Secondary | ICD-10-CM | POA: Diagnosis not present

## 2023-10-02 ENCOUNTER — Ambulatory Visit (HOSPITAL_COMMUNITY): Payer: Medicare PPO | Admitting: Physical Therapy

## 2023-10-05 DIAGNOSIS — N1832 Chronic kidney disease, stage 3b: Secondary | ICD-10-CM | POA: Diagnosis not present

## 2023-10-05 DIAGNOSIS — E1129 Type 2 diabetes mellitus with other diabetic kidney complication: Secondary | ICD-10-CM | POA: Diagnosis not present

## 2023-10-05 DIAGNOSIS — E1165 Type 2 diabetes mellitus with hyperglycemia: Secondary | ICD-10-CM | POA: Diagnosis not present

## 2023-10-05 DIAGNOSIS — D649 Anemia, unspecified: Secondary | ICD-10-CM | POA: Diagnosis not present

## 2023-10-05 DIAGNOSIS — N179 Acute kidney failure, unspecified: Secondary | ICD-10-CM | POA: Diagnosis not present

## 2023-10-05 DIAGNOSIS — Z09 Encounter for follow-up examination after completed treatment for conditions other than malignant neoplasm: Secondary | ICD-10-CM | POA: Diagnosis not present

## 2023-10-06 ENCOUNTER — Ambulatory Visit (HOSPITAL_COMMUNITY): Payer: Medicare PPO

## 2023-10-09 ENCOUNTER — Ambulatory Visit (HOSPITAL_COMMUNITY): Payer: Medicare PPO

## 2023-10-09 DIAGNOSIS — R933 Abnormal findings on diagnostic imaging of other parts of digestive tract: Secondary | ICD-10-CM | POA: Diagnosis not present

## 2023-10-09 DIAGNOSIS — D649 Anemia, unspecified: Secondary | ICD-10-CM | POA: Diagnosis not present

## 2023-10-12 DIAGNOSIS — D649 Anemia, unspecified: Secondary | ICD-10-CM | POA: Diagnosis not present

## 2023-10-13 ENCOUNTER — Ambulatory Visit (HOSPITAL_COMMUNITY): Payer: Medicare PPO | Admitting: Physical Therapy

## 2023-10-16 ENCOUNTER — Ambulatory Visit (HOSPITAL_COMMUNITY): Payer: Medicare PPO

## 2023-10-26 DIAGNOSIS — D500 Iron deficiency anemia secondary to blood loss (chronic): Secondary | ICD-10-CM | POA: Diagnosis not present

## 2023-11-30 DIAGNOSIS — E782 Mixed hyperlipidemia: Secondary | ICD-10-CM | POA: Diagnosis not present

## 2023-11-30 DIAGNOSIS — D649 Anemia, unspecified: Secondary | ICD-10-CM | POA: Diagnosis not present

## 2023-11-30 DIAGNOSIS — E1122 Type 2 diabetes mellitus with diabetic chronic kidney disease: Secondary | ICD-10-CM | POA: Diagnosis not present

## 2023-11-30 DIAGNOSIS — I10 Essential (primary) hypertension: Secondary | ICD-10-CM | POA: Diagnosis not present

## 2023-11-30 DIAGNOSIS — N184 Chronic kidney disease, stage 4 (severe): Secondary | ICD-10-CM | POA: Diagnosis not present

## 2023-11-30 DIAGNOSIS — E11319 Type 2 diabetes mellitus with unspecified diabetic retinopathy without macular edema: Secondary | ICD-10-CM | POA: Diagnosis not present

## 2023-12-07 DIAGNOSIS — D500 Iron deficiency anemia secondary to blood loss (chronic): Secondary | ICD-10-CM | POA: Diagnosis not present

## 2024-01-14 DIAGNOSIS — D649 Anemia, unspecified: Secondary | ICD-10-CM | POA: Diagnosis not present

## 2024-01-14 DIAGNOSIS — N184 Chronic kidney disease, stage 4 (severe): Secondary | ICD-10-CM | POA: Diagnosis not present

## 2024-01-14 DIAGNOSIS — D500 Iron deficiency anemia secondary to blood loss (chronic): Secondary | ICD-10-CM | POA: Diagnosis not present

## 2024-01-14 DIAGNOSIS — D6489 Other specified anemias: Secondary | ICD-10-CM | POA: Diagnosis not present

## 2024-01-14 DIAGNOSIS — E1165 Type 2 diabetes mellitus with hyperglycemia: Secondary | ICD-10-CM | POA: Diagnosis not present

## 2024-01-14 DIAGNOSIS — D631 Anemia in chronic kidney disease: Secondary | ICD-10-CM | POA: Diagnosis not present

## 2024-01-14 DIAGNOSIS — E538 Deficiency of other specified B group vitamins: Secondary | ICD-10-CM | POA: Diagnosis not present

## 2024-02-18 DIAGNOSIS — E113513 Type 2 diabetes mellitus with proliferative diabetic retinopathy with macular edema, bilateral: Secondary | ICD-10-CM | POA: Diagnosis not present

## 2024-02-18 DIAGNOSIS — Z961 Presence of intraocular lens: Secondary | ICD-10-CM | POA: Diagnosis not present

## 2024-02-18 DIAGNOSIS — H524 Presbyopia: Secondary | ICD-10-CM | POA: Diagnosis not present

## 2024-02-18 DIAGNOSIS — H40023 Open angle with borderline findings, high risk, bilateral: Secondary | ICD-10-CM | POA: Diagnosis not present

## 2024-02-18 DIAGNOSIS — H52203 Unspecified astigmatism, bilateral: Secondary | ICD-10-CM | POA: Diagnosis not present

## 2024-02-24 DIAGNOSIS — N184 Chronic kidney disease, stage 4 (severe): Secondary | ICD-10-CM | POA: Diagnosis not present

## 2024-02-24 DIAGNOSIS — I129 Hypertensive chronic kidney disease with stage 1 through stage 4 chronic kidney disease, or unspecified chronic kidney disease: Secondary | ICD-10-CM | POA: Diagnosis not present

## 2024-02-24 DIAGNOSIS — E1122 Type 2 diabetes mellitus with diabetic chronic kidney disease: Secondary | ICD-10-CM | POA: Diagnosis not present

## 2024-03-03 DIAGNOSIS — D631 Anemia in chronic kidney disease: Secondary | ICD-10-CM | POA: Diagnosis not present

## 2024-03-03 DIAGNOSIS — D649 Anemia, unspecified: Secondary | ICD-10-CM | POA: Diagnosis not present

## 2024-03-03 DIAGNOSIS — N184 Chronic kidney disease, stage 4 (severe): Secondary | ICD-10-CM | POA: Diagnosis not present

## 2024-05-23 DIAGNOSIS — E113511 Type 2 diabetes mellitus with proliferative diabetic retinopathy with macular edema, right eye: Secondary | ICD-10-CM | POA: Diagnosis not present

## 2024-05-25 DIAGNOSIS — D631 Anemia in chronic kidney disease: Secondary | ICD-10-CM | POA: Diagnosis not present

## 2024-05-25 DIAGNOSIS — N184 Chronic kidney disease, stage 4 (severe): Secondary | ICD-10-CM | POA: Diagnosis not present

## 2024-05-25 DIAGNOSIS — E6609 Other obesity due to excess calories: Secondary | ICD-10-CM | POA: Diagnosis not present

## 2024-05-25 DIAGNOSIS — D500 Iron deficiency anemia secondary to blood loss (chronic): Secondary | ICD-10-CM | POA: Diagnosis not present

## 2024-05-25 DIAGNOSIS — I152 Hypertension secondary to endocrine disorders: Secondary | ICD-10-CM | POA: Diagnosis not present

## 2024-05-25 DIAGNOSIS — E1159 Type 2 diabetes mellitus with other circulatory complications: Secondary | ICD-10-CM | POA: Diagnosis not present

## 2024-05-25 DIAGNOSIS — Z6834 Body mass index (BMI) 34.0-34.9, adult: Secondary | ICD-10-CM | POA: Diagnosis not present

## 2024-05-25 DIAGNOSIS — E66811 Obesity, class 1: Secondary | ICD-10-CM | POA: Diagnosis not present

## 2024-06-01 DIAGNOSIS — H40023 Open angle with borderline findings, high risk, bilateral: Secondary | ICD-10-CM | POA: Diagnosis not present

## 2024-06-02 DIAGNOSIS — E1122 Type 2 diabetes mellitus with diabetic chronic kidney disease: Secondary | ICD-10-CM | POA: Diagnosis not present

## 2024-06-02 DIAGNOSIS — I129 Hypertensive chronic kidney disease with stage 1 through stage 4 chronic kidney disease, or unspecified chronic kidney disease: Secondary | ICD-10-CM | POA: Diagnosis not present

## 2024-06-02 DIAGNOSIS — N184 Chronic kidney disease, stage 4 (severe): Secondary | ICD-10-CM | POA: Diagnosis not present

## 2024-06-08 DIAGNOSIS — N184 Chronic kidney disease, stage 4 (severe): Secondary | ICD-10-CM | POA: Diagnosis not present

## 2024-06-08 DIAGNOSIS — Z6834 Body mass index (BMI) 34.0-34.9, adult: Secondary | ICD-10-CM | POA: Diagnosis not present

## 2024-06-08 DIAGNOSIS — E1122 Type 2 diabetes mellitus with diabetic chronic kidney disease: Secondary | ICD-10-CM | POA: Diagnosis not present

## 2024-06-08 DIAGNOSIS — E785 Hyperlipidemia, unspecified: Secondary | ICD-10-CM | POA: Diagnosis not present

## 2024-06-08 DIAGNOSIS — I10 Essential (primary) hypertension: Secondary | ICD-10-CM | POA: Diagnosis not present

## 2024-06-08 DIAGNOSIS — Z0000 Encounter for general adult medical examination without abnormal findings: Secondary | ICD-10-CM | POA: Diagnosis not present

## 2024-06-16 DIAGNOSIS — E113512 Type 2 diabetes mellitus with proliferative diabetic retinopathy with macular edema, left eye: Secondary | ICD-10-CM | POA: Diagnosis not present

## 2024-06-23 DIAGNOSIS — E113513 Type 2 diabetes mellitus with proliferative diabetic retinopathy with macular edema, bilateral: Secondary | ICD-10-CM | POA: Diagnosis not present

## 2024-06-23 DIAGNOSIS — H59813 Chorioretinal scars after surgery for detachment, bilateral: Secondary | ICD-10-CM | POA: Diagnosis not present

## 2024-06-23 DIAGNOSIS — H3582 Retinal ischemia: Secondary | ICD-10-CM | POA: Diagnosis not present

## 2024-06-27 DIAGNOSIS — E113511 Type 2 diabetes mellitus with proliferative diabetic retinopathy with macular edema, right eye: Secondary | ICD-10-CM | POA: Diagnosis not present

## 2024-07-20 DIAGNOSIS — E113512 Type 2 diabetes mellitus with proliferative diabetic retinopathy with macular edema, left eye: Secondary | ICD-10-CM | POA: Diagnosis not present
